# Patient Record
Sex: Female | Born: 1945 | Race: White | Hispanic: No
Health system: Southern US, Community
[De-identification: ages and names within clinical notes are randomized; demographics above are authoritative.]

## PROBLEM LIST (undated history)

## (undated) DIAGNOSIS — I519 Heart disease, unspecified: Secondary | ICD-10-CM

## (undated) DIAGNOSIS — Z978 Presence of other specified devices: Secondary | ICD-10-CM

## (undated) DIAGNOSIS — I4891 Unspecified atrial fibrillation: Secondary | ICD-10-CM

## (undated) DIAGNOSIS — Z933 Colostomy status: Secondary | ICD-10-CM

## (undated) DIAGNOSIS — D649 Anemia, unspecified: Secondary | ICD-10-CM

## (undated) DIAGNOSIS — I509 Heart failure, unspecified: Secondary | ICD-10-CM

## (undated) DIAGNOSIS — E785 Hyperlipidemia, unspecified: Secondary | ICD-10-CM

## (undated) DIAGNOSIS — I219 Acute myocardial infarction, unspecified: Secondary | ICD-10-CM

## (undated) DIAGNOSIS — F0390 Unspecified dementia without behavioral disturbance: Secondary | ICD-10-CM

## (undated) DIAGNOSIS — L8990 Pressure ulcer of unspecified site, unspecified stage: Secondary | ICD-10-CM

## (undated) DIAGNOSIS — E119 Type 2 diabetes mellitus without complications: Secondary | ICD-10-CM

## (undated) DIAGNOSIS — N189 Chronic kidney disease, unspecified: Secondary | ICD-10-CM

## (undated) HISTORY — DX: Hyperlipidemia, unspecified: E78.5

## (undated) HISTORY — DX: Colostomy status: Z93.3

## (undated) HISTORY — DX: Heart disease, unspecified: I51.9

## (undated) HISTORY — DX: Anemia, unspecified: D64.9

## (undated) HISTORY — PX: COLOSTOMY: SHX63

## (undated) HISTORY — DX: Chronic kidney disease, unspecified: N18.9

## (undated) HISTORY — DX: Type 2 diabetes mellitus without complications: E11.9

## (undated) HISTORY — DX: Unspecified dementia, unspecified severity, without behavioral disturbance, psychotic disturbance, mood disturbance, and anxiety: F03.90

## (undated) HISTORY — DX: Acute myocardial infarction, unspecified: I21.9

## (undated) HISTORY — DX: Presence of other specified devices: Z97.8

## (undated) HISTORY — DX: Heart failure, unspecified: I50.9

## (undated) HISTORY — DX: Pressure ulcer of unspecified site, unspecified stage: L89.90

## (undated) HISTORY — DX: Unspecified atrial fibrillation: I48.91

---

## 2020-02-24 ENCOUNTER — Encounter: Payer: Self-pay | Admitting: Vascular Surgery

## 2020-02-24 ENCOUNTER — Other Ambulatory Visit: Payer: Self-pay

## 2020-02-24 ENCOUNTER — Ambulatory Visit (INDEPENDENT_AMBULATORY_CARE_PROVIDER_SITE_OTHER): Payer: Medicare Other | Admitting: Vascular Surgery

## 2020-02-24 VITALS — BP 116/68 | HR 74 | Resp 20 | Wt 143.0 lb

## 2020-02-24 DIAGNOSIS — I70299 Other atherosclerosis of native arteries of extremities, unspecified extremity: Secondary | ICD-10-CM | POA: Diagnosis not present

## 2020-02-24 DIAGNOSIS — L97909 Non-pressure chronic ulcer of unspecified part of unspecified lower leg with unspecified severity: Secondary | ICD-10-CM

## 2020-02-24 NOTE — H&P (View-Only) (Signed)
 REASON FOR CONSULT:    Nonhealing wounds both feet.  The consult is requested by [facility].  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  Without revascularization I think the wounds are unlikely to heal and she would ultimately require bilateral amputations.  This is a complicated situation and that the patient does have some underlying dementia and there is no family available.  I have done my best to explain the situation to the patient and I feel that it would be reasonable to proceed with arteriography to see if there are any options from an endovascular standpoint to improve the circulation and improve the chances of healing these wounds.  I do not think she would be a good surgical candidate.  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  Although her duplex scan suggested a common femoral artery occlusion on the right I believe that I can feel a pulse on the right perhaps above the occlusion.  On the left side I was unable to feel a femoral pulse although I had to examine her in the wheelchair which certainly limits my exam.  I have reviewed with the patient the indications for arteriography. In addition, I have reviewed the potential complications of arteriography including but not limited to: Bleeding, arterial injury, arterial thrombosis, dye action, renal insufficiency, or other unpredictable medical problems. I have explained to the patient that if we find disease amenable to angioplasty we could potentially address this at the same time. I have discussed the potential complications of angioplasty and stenting, including but not limited to: Bleeding, arterial thrombosis, arterial injury, dissection, or the need for surgical intervention.  Her arteriogram is scheduled for 03/04/2020.  We will hold her Eliquis for 48  hours prior to the procedure.  I do not have any preoperative labs obviously we will have to check her renal function to be sure that she can receive contrast.  Otherwise she would require CO2.   Sabrina Maloney, MD Office: [phone]   HPI:   [name] is a pleasant 74 y.o. female, who is referred with wounds on both feet.  I have reviewed the records from [facility].  Patient was seen on 02/08/2020 with significant ulcerations of both feet and surrounding erythema.  She also had erythema of both heels.  She is sent for vascular consultation.  The patient has a history of underlying dementia so the history I think is not completely reliable.  She is not sure when she developed the wounds on her feet.  She has wounds on both heels and also wounds on the toes of both feet.  I do not think that she is ambulatory.  She denies any history of rest pain.  Her risk factors for peripheral vascular disease include diabetes, hypercholesterolemia, and hypertension.  She denies tobacco use.  There is no history of premature cardiovascular disease that I can determine.  I reviewed the records that were sent with the patient and the patient does have a history of type 2 diabetes, history of a CVA with right-sided weakness and dysphagia, hyperlipidemia, vascular dementia, coronary artery disease, previous myocardial infarction, paroxysmal atrial fibrillation, chronic systolic congestive heart failure, and stage III chronic kidney disease.  She is also had a previous colostomy.   Past Medical History:  Diagnosis Date  . A-fib (  HCC)   . Anemia   . CHF (congestive heart failure) (HCC)   . Chronic kidney disease   . Colostomy present (HCC)   . Dementia (HCC)   . Diabetes mellitus without complication (HCC)   . Foley catheter in place   . Heart disease   . Hyperlipidemia   . Myocardial infarction (HCC)   . Pressure ulcer     No family history on  file.  SOCIAL HISTORY: Social History   Socioeconomic History  . Marital status: Unknown    Spouse name: Not on file  . Number of children: Not on file  . Years of education: Not on file  . Highest education level: Not on file  Occupational History  . Not on file  Tobacco Use  . Smoking status: Unknown If Ever Smoked  . Smokeless tobacco: Never Used  Substance and Sexual Activity  . Alcohol use: Not on file  . Drug use: Not on file  . Sexual activity: Not on file  Other Topics Concern  . Not on file  Social History Narrative  . Not on file   Social Determinants of Health   Financial Resource Strain:   . Difficulty of Paying Living Expenses:   Food Insecurity:   . Worried About Programme researcher, broadcasting/film/video in the Last Year:   . Barista in the Last Year:   Transportation Needs:   . Freight forwarder (Medical):   Marland Kitchen Lack of Transportation (Non-Medical):   Physical Activity:   . Days of Exercise per Week:   . Minutes of Exercise per Session:   Stress:   . Feeling of Stress :   Social Connections:   . Frequency of Communication with Friends and Family:   . Frequency of Social Gatherings with Friends and Family:   . Attends Religious Services:   . Active Member of Clubs or Organizations:   . Attends Banker Meetings:   Marland Kitchen Marital Status:   Intimate Partner Violence:   . Fear of Current or Ex-Partner:   . Emotionally Abused:   Marland Kitchen Physically Abused:   . Sexually Abused:     Allergies  Allergen Reactions  . Codeine   . Erythromycin   . Sulfa Antibiotics     Current Outpatient Medications  Medication Sig Dispense Refill  . Amino Acids (AMINO ACID PO) Take by mouth.    Marland Kitchen apixaban (ELIQUIS) 5 MG TABS tablet Take 5 mg by mouth 2 (two) times daily.    . Ascorbic Acid (VITAMIN C) 100 MG tablet Take 100 mg by mouth daily.    Marland Kitchen aspirin EC 81 MG tablet Take 81 mg by mouth daily.    . betamethasone dipropionate 0.05 % cream Apply topically 2 (two) times  daily.    . cholecalciferol (VITAMIN D3) 25 MCG (1000 UNIT) tablet Take 1,000 Units by mouth daily.    . famotidine (PEPCID) 20 MG tablet Take 20 mg by mouth 2 (two) times daily.    Marland Kitchen gabapentin (NEURONTIN) 100 MG capsule Take 200 mg by mouth 3 (three) times daily.    . insulin detemir (LEVEMIR) 100 UNIT/ML injection Inject into the skin daily.    . insulin lispro (HUMALOG KWIKPEN) 100 UNIT/ML KwikPen Inject into the skin.    . metoprolol succinate (TOPROL-XL) 12.5 mg TB24 24 hr tablet Take 12.5 mg by mouth daily.    . Multiple Vitamin (MULTIVITAMIN) capsule Take 1 capsule by mouth daily.    . polyethylene glycol (MIRALAX /  GLYCOLAX) 17 g packet Take 17 g by mouth daily.    . QUEtiapine (SEROQUEL) 50 MG tablet Take 50 mg by mouth at bedtime.    . tamsulosin (FLOMAX) 0.4 MG CAPS capsule Take 0.4 mg by mouth.    . zinc gluconate 50 MG tablet Take 50 mg by mouth daily.     No current facility-administered medications for this visit.    REVIEW OF SYSTEMS:  [X]  denotes positive finding, [ ]  denotes negative finding Cardiac  Comments:  Chest pain or chest pressure:    Shortness of breath upon exertion:    Short of breath when lying flat:    Irregular heart rhythm:        Vascular    Pain in calf, thigh, or hip brought on by ambulation:    Pain in feet at night that wakes you up from your sleep:     Blood clot in your veins:    Leg swelling:         Pulmonary    Oxygen at home:    Productive cough:     Wheezing:         Neurologic    Sudden weakness in arms or legs:     Sudden numbness in arms or legs:     Sudden onset of difficulty speaking or slurred speech:    Temporary loss of vision in one eye:     Problems with dizziness:         Gastrointestinal    Blood in stool:     Vomited blood:         Genitourinary    Burning when urinating:     Blood in urine:        Psychiatric    Major depression:         Hematologic    Bleeding problems:    Problems with blood clotting  too easily:        Skin    Rashes or ulcers:        Constitutional    Fever or chills:     PHYSICAL EXAM:   Vitals:   02/24/20 1240  BP: 116/68  Pulse: 74  Resp: 20  SpO2: 95%  Weight: 143 lb (64.9 kg)    GENERAL: The patient is a well-nourished female, in no acute distress. The vital signs are documented above. CARDIAC: There is a regular rate and rhythm.  VASCULAR: I do not detect carotid bruits. I had to examine her in the wheelchair which limits my exam.  I believe that I could feel a femoral pulse on the right but could not feel one on the left.  Based on her duplex findings perhaps some feeling the pulse on the right above the occlusion. I cannot palpate pedal pulses. She has mild bilateral lower extremity swelling. PULMONARY: There is good air exchange bilaterally without wheezing or rales. ABDOMEN: Soft and non-tender with normal pitched bowel sounds.  MUSCULOSKELETAL: There are no major deformities or cyanosis. NEUROLOGIC: No focal weakness or paresthesias are detected. SKIN: RIGHT FOOT:    LEFT FOOT:     PSYCHIATRIC: The patient has a normal affect.  DATA:    ARTERIAL DUPLEX: I did review an arterial duplex scan that was done on 02/04/2020.  This showed that on the right side the common femoral and superficial femoral arteries were occluded.  There was also evidence of significant disease on the left side without occlusions noted.  ABIs could not be performed because the patient  has wounds on both feet.

## 2020-02-24 NOTE — Progress Notes (Signed)
REASON FOR CONSULT:    Nonhealing wounds both feet.  The consult is requested by [facility].  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  Without revascularization I think the wounds are unlikely to heal and she would ultimately require bilateral amputations.  This is a complicated situation and that the patient does have some underlying dementia and there is no family available.  I have done my best to explain the situation to the patient and I feel that it would be reasonable to proceed with arteriography to see if there are any options from an endovascular standpoint to improve the circulation and improve the chances of healing these wounds.  I do not think she would be a good surgical candidate.  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  Although her duplex scan suggested a common femoral artery occlusion on the right I believe that I can feel a pulse on the right perhaps above the occlusion.  On the left side I was unable to feel a femoral pulse although I had to examine her in the wheelchair which certainly limits my exam.  I have reviewed with the patient the indications for arteriography. In addition, I have reviewed the potential complications of arteriography including but not limited to: Bleeding, arterial injury, arterial thrombosis, dye action, renal insufficiency, or other unpredictable medical problems. I have explained to the patient that if we find disease amenable to angioplasty we could potentially address this at the same time. I have discussed the potential complications of angioplasty and stenting, including but not limited to: Bleeding, arterial thrombosis, arterial injury, dissection, or the need for surgical intervention.  Her arteriogram is scheduled for 03/04/2020.  We will hold her Eliquis for 48  hours prior to the procedure.  I do not have any preoperative labs obviously we will have to check her renal function to be sure that she can receive contrast.  Otherwise she would require CO2.   Waverly Ferrari, MD Office: [phone]   HPI:   [name] is a pleasant 74 y.o. female, who is referred with wounds on both feet.  I have reviewed the records from [facility] and rehabilitation center.  Patient was seen on 02/08/2020 with significant ulcerations of both feet and surrounding erythema.  She also had erythema of both heels.  She is sent for vascular consultation.  The patient has a history of underlying dementia so the history I think is not completely reliable.  She is not sure when she developed the wounds on her feet.  She has wounds on both heels and also wounds on the toes of both feet.  I do not think that she is ambulatory.  She denies any history of rest pain.  Her risk factors for peripheral vascular disease include diabetes, hypercholesterolemia, and hypertension.  She denies tobacco use.  There is no history of premature cardiovascular disease that I can determine.  I reviewed the records that were sent with the patient and the patient does have a history of type 2 diabetes, history of a CVA with right-sided weakness and dysphagia, hyperlipidemia, vascular dementia, coronary artery disease, previous myocardial infarction, paroxysmal atrial fibrillation, chronic systolic congestive heart failure, and stage III chronic kidney disease.  She is also had a previous colostomy.   Past Medical History:  Diagnosis Date  . A-fib (  HCC)   . Anemia   . CHF (congestive heart failure) (HCC)   . Chronic kidney disease   . Colostomy present (HCC)   . Dementia (HCC)   . Diabetes mellitus without complication (HCC)   . Foley catheter in place   . Heart disease   . Hyperlipidemia   . Myocardial infarction (HCC)   . Pressure ulcer     No family history on  file.  SOCIAL HISTORY: Social History   Socioeconomic History  . Marital status: Unknown    Spouse name: Not on file  . Number of children: Not on file  . Years of education: Not on file  . Highest education level: Not on file  Occupational History  . Not on file  Tobacco Use  . Smoking status: Unknown If Ever Smoked  . Smokeless tobacco: Never Used  Substance and Sexual Activity  . Alcohol use: Not on file  . Drug use: Not on file  . Sexual activity: Not on file  Other Topics Concern  . Not on file  Social History Narrative  . Not on file   Social Determinants of Health   Financial Resource Strain:   . Difficulty of Paying Living Expenses:   Food Insecurity:   . Worried About Programme researcher, broadcasting/film/video in the Last Year:   . Barista in the Last Year:   Transportation Needs:   . Freight forwarder (Medical):   Marland Kitchen Lack of Transportation (Non-Medical):   Physical Activity:   . Days of Exercise per Week:   . Minutes of Exercise per Session:   Stress:   . Feeling of Stress :   Social Connections:   . Frequency of Communication with Friends and Family:   . Frequency of Social Gatherings with Friends and Family:   . Attends Religious Services:   . Active Member of Clubs or Organizations:   . Attends Banker Meetings:   Marland Kitchen Marital Status:   Intimate Partner Violence:   . Fear of Current or Ex-Partner:   . Emotionally Abused:   Marland Kitchen Physically Abused:   . Sexually Abused:     Allergies  Allergen Reactions  . Codeine   . Erythromycin   . Sulfa Antibiotics     Current Outpatient Medications  Medication Sig Dispense Refill  . Amino Acids (AMINO ACID PO) Take by mouth.    Marland Kitchen apixaban (ELIQUIS) 5 MG TABS tablet Take 5 mg by mouth 2 (two) times daily.    . Ascorbic Acid (VITAMIN C) 100 MG tablet Take 100 mg by mouth daily.    Marland Kitchen aspirin EC 81 MG tablet Take 81 mg by mouth daily.    . betamethasone dipropionate 0.05 % cream Apply topically 2 (two) times  daily.    . cholecalciferol (VITAMIN D3) 25 MCG (1000 UNIT) tablet Take 1,000 Units by mouth daily.    . famotidine (PEPCID) 20 MG tablet Take 20 mg by mouth 2 (two) times daily.    Marland Kitchen gabapentin (NEURONTIN) 100 MG capsule Take 200 mg by mouth 3 (three) times daily.    . insulin detemir (LEVEMIR) 100 UNIT/ML injection Inject into the skin daily.    . insulin lispro (HUMALOG KWIKPEN) 100 UNIT/ML KwikPen Inject into the skin.    . metoprolol succinate (TOPROL-XL) 12.5 mg TB24 24 hr tablet Take 12.5 mg by mouth daily.    . Multiple Vitamin (MULTIVITAMIN) capsule Take 1 capsule by mouth daily.    . polyethylene glycol (MIRALAX /  GLYCOLAX) 17 g packet Take 17 g by mouth daily.    . QUEtiapine (SEROQUEL) 50 MG tablet Take 50 mg by mouth at bedtime.    . tamsulosin (FLOMAX) 0.4 MG CAPS capsule Take 0.4 mg by mouth.    . zinc gluconate 50 MG tablet Take 50 mg by mouth daily.     No current facility-administered medications for this visit.    REVIEW OF SYSTEMS:  [X]  denotes positive finding, [ ]  denotes negative finding Cardiac  Comments:  Chest pain or chest pressure:    Shortness of breath upon exertion:    Short of breath when lying flat:    Irregular heart rhythm:        Vascular    Pain in calf, thigh, or hip brought on by ambulation:    Pain in feet at night that wakes you up from your sleep:     Blood clot in your veins:    Leg swelling:         Pulmonary    Oxygen at home:    Productive cough:     Wheezing:         Neurologic    Sudden weakness in arms or legs:     Sudden numbness in arms or legs:     Sudden onset of difficulty speaking or slurred speech:    Temporary loss of vision in one eye:     Problems with dizziness:         Gastrointestinal    Blood in stool:     Vomited blood:         Genitourinary    Burning when urinating:     Blood in urine:        Psychiatric    Major depression:         Hematologic    Bleeding problems:    Problems with blood clotting  too easily:        Skin    Rashes or ulcers:        Constitutional    Fever or chills:     PHYSICAL EXAM:   Vitals:   02/24/20 1240  BP: 116/68  Pulse: 74  Resp: 20  SpO2: 95%  Weight: 143 lb (64.9 kg)    GENERAL: The patient is a well-nourished female, in no acute distress. The vital signs are documented above. CARDIAC: There is a regular rate and rhythm.  VASCULAR: I do not detect carotid bruits. I had to examine her in the wheelchair which limits my exam.  I believe that I could feel a femoral pulse on the right but could not feel one on the left.  Based on her duplex findings perhaps some feeling the pulse on the right above the occlusion. I cannot palpate pedal pulses. She has mild bilateral lower extremity swelling. PULMONARY: There is good air exchange bilaterally without wheezing or rales. ABDOMEN: Soft and non-tender with normal pitched bowel sounds.  MUSCULOSKELETAL: There are no major deformities or cyanosis. NEUROLOGIC: No focal weakness or paresthesias are detected. SKIN: RIGHT FOOT:    LEFT FOOT:     PSYCHIATRIC: The patient has a normal affect.  DATA:    ARTERIAL DUPLEX: I did review an arterial duplex scan that was done on 02/04/2020.  This showed that on the right side the common femoral and superficial femoral arteries were occluded.  There was also evidence of significant disease on the left side without occlusions noted.  ABIs could not be performed because the patient  has wounds on both feet.

## 2020-02-26 ENCOUNTER — Other Ambulatory Visit: Payer: Self-pay

## 2020-03-03 ENCOUNTER — Other Ambulatory Visit (HOSPITAL_COMMUNITY): Payer: Self-pay | Admitting: *Deleted

## 2020-03-04 ENCOUNTER — Encounter (HOSPITAL_COMMUNITY): Admission: RE | Payer: Self-pay | Source: Home / Self Care

## 2020-03-04 ENCOUNTER — Ambulatory Visit (HOSPITAL_COMMUNITY): Admission: RE | Admit: 2020-03-04 | Payer: Medicare Other | Source: Home / Self Care | Admitting: Vascular Surgery

## 2020-03-04 SURGERY — ABDOMINAL AORTOGRAM W/LOWER EXTREMITY
Anesthesia: LOCAL

## 2020-03-16 ENCOUNTER — Telehealth: Payer: Self-pay

## 2020-03-16 ENCOUNTER — Other Ambulatory Visit: Payer: Self-pay

## 2020-03-16 ENCOUNTER — Emergency Department (HOSPITAL_COMMUNITY): Payer: Medicare Other

## 2020-03-16 ENCOUNTER — Emergency Department (HOSPITAL_COMMUNITY)
Admission: EM | Admit: 2020-03-16 | Discharge: 2020-03-16 | Disposition: A | Discharge status: Home / Self Care | Payer: Medicare Other | Attending: Emergency Medicine | Admitting: Emergency Medicine

## 2020-03-16 ENCOUNTER — Encounter (HOSPITAL_COMMUNITY): Payer: Self-pay | Admitting: Emergency Medicine

## 2020-03-16 DIAGNOSIS — S0083XA Contusion of other part of head, initial encounter: Secondary | ICD-10-CM | POA: Insufficient documentation

## 2020-03-16 DIAGNOSIS — Y929 Unspecified place or not applicable: Secondary | ICD-10-CM | POA: Insufficient documentation

## 2020-03-16 DIAGNOSIS — W19XXXA Unspecified fall, initial encounter: Secondary | ICD-10-CM | POA: Diagnosis not present

## 2020-03-16 DIAGNOSIS — N189 Chronic kidney disease, unspecified: Secondary | ICD-10-CM | POA: Insufficient documentation

## 2020-03-16 DIAGNOSIS — S0011XA Contusion of right eyelid and periocular area, initial encounter: Secondary | ICD-10-CM | POA: Insufficient documentation

## 2020-03-16 DIAGNOSIS — Z7982 Long term (current) use of aspirin: Secondary | ICD-10-CM | POA: Diagnosis not present

## 2020-03-16 DIAGNOSIS — F0390 Unspecified dementia without behavioral disturbance: Secondary | ICD-10-CM | POA: Diagnosis not present

## 2020-03-16 DIAGNOSIS — S0990XA Unspecified injury of head, initial encounter: Secondary | ICD-10-CM

## 2020-03-16 DIAGNOSIS — Z794 Long term (current) use of insulin: Secondary | ICD-10-CM | POA: Diagnosis not present

## 2020-03-16 DIAGNOSIS — Y999 Unspecified external cause status: Secondary | ICD-10-CM | POA: Insufficient documentation

## 2020-03-16 DIAGNOSIS — R8290 Unspecified abnormal findings in urine: Secondary | ICD-10-CM | POA: Diagnosis not present

## 2020-03-16 DIAGNOSIS — E1122 Type 2 diabetes mellitus with diabetic chronic kidney disease: Secondary | ICD-10-CM | POA: Diagnosis not present

## 2020-03-16 DIAGNOSIS — I129 Hypertensive chronic kidney disease with stage 1 through stage 4 chronic kidney disease, or unspecified chronic kidney disease: Secondary | ICD-10-CM | POA: Insufficient documentation

## 2020-03-16 DIAGNOSIS — Y939 Activity, unspecified: Secondary | ICD-10-CM | POA: Diagnosis not present

## 2020-03-16 LAB — URINALYSIS, ROUTINE W REFLEX MICROSCOPIC
Bilirubin Urine: NEGATIVE
Glucose, UA: NEGATIVE mg/dL
Hgb urine dipstick: NEGATIVE
Ketones, ur: NEGATIVE mg/dL
Nitrite: NEGATIVE
Protein, ur: 30 mg/dL — AB
Specific Gravity, Urine: 1.017 (ref 1.005–1.030)
WBC, UA: >50 WBC/hpf — ABNORMAL HIGH (ref 0–5)
pH: 5 (ref 5.0–8.0)

## 2020-03-16 LAB — CBC WITH DIFFERENTIAL/PLATELET
Abs Immature Granulocytes: 0.03 10*3/uL (ref 0.00–0.07)
Basophils Absolute: 0 10*3/uL (ref 0.0–0.1)
Basophils Relative: 1 %
Eosinophils Absolute: 0.3 10*3/uL (ref 0.0–0.5)
Eosinophils Relative: 4 %
HCT: 27.4 % — ABNORMAL LOW (ref 36.0–46.0)
Hemoglobin: 8.1 g/dL — ABNORMAL LOW (ref 12.0–15.0)
Immature Granulocytes: 0 %
Lymphocytes Relative: 27 %
Lymphs Abs: 2.2 10*3/uL (ref 0.7–4.0)
MCH: 25.9 pg — ABNORMAL LOW (ref 26.0–34.0)
MCHC: 29.6 g/dL — ABNORMAL LOW (ref 30.0–36.0)
MCV: 87.5 fL (ref 80.0–100.0)
Monocytes Absolute: 0.7 10*3/uL (ref 0.1–1.0)
Monocytes Relative: 9 %
Neutro Abs: 4.7 10*3/uL (ref 1.7–7.7)
Neutrophils Relative %: 59 %
Platelets: 343 10*3/uL (ref 150–400)
RBC: 3.13 MIL/uL — ABNORMAL LOW (ref 3.87–5.11)
RDW: 17.7 % — ABNORMAL HIGH (ref 11.5–15.5)
WBC: 7.8 10*3/uL (ref 4.0–10.5)
nRBC: 0 % (ref 0.0–0.2)

## 2020-03-16 LAB — BASIC METABOLIC PANEL
Anion gap: 8 (ref 5–15)
BUN: 34 mg/dL — ABNORMAL HIGH (ref 8–23)
CO2: 23 mmol/L (ref 22–32)
Calcium: 8.2 mg/dL — ABNORMAL LOW (ref 8.9–10.3)
Chloride: 109 mmol/L (ref 98–111)
Creatinine, Ser: 1.21 mg/dL — ABNORMAL HIGH (ref 0.44–1.00)
GFR calc Af Amer: 52 mL/min — ABNORMAL LOW (ref >60)
GFR calc non Af Amer: 45 mL/min — ABNORMAL LOW (ref >60)
Glucose, Bld: 111 mg/dL — ABNORMAL HIGH (ref 70–99)
Potassium: 3.9 mmol/L (ref 3.5–5.1)
Sodium: 140 mmol/L (ref 135–145)

## 2020-03-16 LAB — POC OCCULT BLOOD, ED: Fecal Occult Bld: NEGATIVE

## 2020-03-16 MED ORDER — CEPHALEXIN 500 MG PO CAPS
500.0000 mg | ORAL_CAPSULE | Freq: Three times a day (TID) | ORAL | 0 refills | Status: DC
Start: 2020-03-16 — End: 2020-04-01

## 2020-03-16 MED ORDER — CEPHALEXIN 500 MG PO CAPS
500.0000 mg | ORAL_CAPSULE | Freq: Once | ORAL | Status: DC
  Filled 2020-03-16: qty 1

## 2020-03-16 MED ORDER — FLUCONAZOLE 150 MG PO TABS
150.0000 mg | ORAL_TABLET | Freq: Once | ORAL | Status: DC
  Filled 2020-03-16: qty 1

## 2020-03-16 NOTE — Discharge Instructions (Signed)
Testing is negative for any serious injury from the fall.  Take the antibiotic for a possible urinary tract infection.  You will be called if the urine culture grows a bacteria that needs a different antibiotic.  Follow-up with your doctor.  Return to the ED with new or worsening symptoms.

## 2020-03-16 NOTE — Telephone Encounter (Signed)
Message from 03/04/20:  Good morning! We spoke to Kizzie Ide at [facility] on 6/11 and gave him this information. We'll be happy to reschedule with the patient and facility.   Almendra Loria       Previous Messages   ----- Message -----  From: Jolyn Nap, RN  Sent: 03/04/2020  8:10 AM EDT  To: Yolonda Kida, LPN  Subject: Reschedule                    Ms. Kimbler did not show for her procedure today. I called nursing home around 6am they said they didn't know anything about her going to procedure for today.    Can you reschedule her please   Thanks   Janne Napoleon  Cath lab

## 2020-03-16 NOTE — ED Notes (Signed)
Daughter: [phone] would like to receive updates

## 2020-03-16 NOTE — ED Provider Notes (Signed)
[facility]GENCY DEPARTMENT Provider Note   CSN: [id number] Arrival date & time: 03/16/20  0153     History Chief Complaint  Patient presents with  . Fall    Lorin Picketheresa Juarez is a 74 y.o. female.  Level 5 caveat for dementia.  Patient brought in from facility after being found on the ground after presumed unwitnessed fall.  She has a hematoma in her right forehead.  Patient unable to give any history.  She is arousable to voice and follows commands but cannot give any meaningful information. She apparently received her nighttime meds including seroquel and ativan prior to arrival. States that she is not hurting anywhere. States that she did fall Denies any chest pain or abdominal pain or back pain.  Past medical history includes:history of type 2 diabetes, history of a CVA with right-sided weakness and dysphagia, hyperlipidemia, vascular dementia, coronary artery disease, previous myocardial infarction, paroxysmal atrial fibrillation, chronic systolic congestive heart failure, and stage III chronic kidney disease.  She is also had a previous colostomy. She has an indwelling Foley in place.  She follows with vascular surgery for chronic wounds on her lower extremities. eliquis is on medication list. Unclear if she is taking it.   The history is provided by the patient and the EMS personnel. The history is limited by the condition of the patient.  Fall       Past Medical History:  Diagnosis Date  . A-fib (HCC)   . Anemia   . CHF (congestive heart failure) (HCC)   . Chronic kidney disease   . Colostomy present (HCC)   . Dementia (HCC)   . Diabetes mellitus without complication (HCC)   . Foley catheter in place   . Heart disease   . Hyperlipidemia   . Myocardial infarction (HCC)   . Pressure ulcer     There are no problems to display for this patient.   Past Surgical History:  Procedure Laterality Date  . COLOSTOMY       OB History   No obstetric history on  file.     No family history on file.  Social History   Tobacco Use  . Smoking status: Unknown If Ever Smoked  . Smokeless tobacco: Never Used  Substance Use Topics  . Alcohol use: Not Currently  . Drug use: Not Currently    Home Medications Prior to Admission medications   Medication Sig Start Date End Date Taking? Authorizing Provider  acetaminophen (TYLENOL) 500 MG tablet Take 1,000 mg by mouth in the morning and at bedtime.    [provider]  Amino Acids-Protein Hydrolys (FEEDING SUPPLEMENT, PRO-STAT SUGAR FREE 64,) LIQD Take 30 mLs by mouth daily with breakfast.    [provider]  apixaban (ELIQUIS) 5 MG TABS tablet Take 5 mg by mouth 2 (two) times daily. Patient not taking: Reported on 03/02/2020    [provider]  Ascorbic Acid (VITAMIN C) 500 MG CHEW Chew 500 mg by mouth in the morning and at bedtime.     [provider]  aspirin EC 81 MG tablet Take 81 mg by mouth daily.    [provider]  Cholecalciferol (VITAMIN D) 50 MCG (2000 UT) tablet Take 2,000 Units by mouth daily.     [provider]  famotidine (PEPCID) 20 MG tablet Take 20 mg by mouth daily with breakfast.     [provider]  gabapentin (NEURONTIN) 100 MG capsule Take 200 mg by mouth 3 (three) times daily.  [provider]  insulin detemir (LEVEMIR) 100 UNIT/ML injection Inject 14 Units into the skin daily.     [provider]  insulin lispro (HUMALOG KWIKPEN) 100 UNIT/ML KwikPen Inject 0-10 Units into the skin 3 (three) times daily with meals. Dose per sliding scale. 0-150 = 0 units; 151-200 = 2 units; 201-250 = 4 units; 251-300 = 6 units; 301-350 = 8 units; 351-400 = 10 units; >400, notify MD.    [provider]  LORazepam (ATIVAN) 0.5 MG tablet Take 0.5 mg by mouth every 6 (six) hours as needed for anxiety (agitation).    [provider]  metoprolol tartrate (LOPRESSOR) 25 MG tablet Take 12.5 mg by mouth daily.     [provider]  Multiple Vitamin (MULTIVITAMIN) capsule Take 1 capsule by mouth daily.    [provider]  polyethylene glycol (MIRALAX / GLYCOLAX) 17 g packet Take 17 g by mouth daily as needed (constipation).     [provider]  QUEtiapine (SEROQUEL) 50 MG tablet Take 50 mg by mouth at bedtime.    [provider]  tamsulosin (FLOMAX) 0.4 MG CAPS capsule Take 0.4 mg by mouth at bedtime.     [provider]  zinc sulfate 220 (50 Zn) MG capsule Take 220 mg by mouth daily.    [provider]    Allergies    Codeine, Erythromycin, and Sulfa antibiotics  Review of Systems   Review of Systems  Unable to perform ROS: Dementia    Physical Exam Updated Vital Signs BP (!) 135/59 (BP Location: Left Arm)   Pulse 81   Temp 98.9 F (37.2 C) (Oral)   Resp 18   Wt 64 kg   SpO2 98%   Physical Exam Vitals and nursing note reviewed.  Constitutional:      General: She is not in acute distress.    Appearance: She is well-developed.     Comments: Somnolent, arouses to voice, answers a few questions and goes back to sleep.  HENT:     Head: Normocephalic.     Comments: Hematoma right eyebrow and forehead    Mouth/Throat:     Pharynx: No oropharyngeal exudate.  Eyes:     Conjunctiva/sclera: Conjunctivae normal.     Pupils: Pupils are equal, round, and reactive to light.  Neck:     Comments: No C-spine tenderness Cardiovascular:     Rate and Rhythm: Normal rate and regular rhythm.     Heart sounds: Normal heart sounds. No murmur heard.   Pulmonary:     Effort: Pulmonary effort is normal. No respiratory distress.     Breath sounds: Normal breath sounds.  Abdominal:     Palpations: Abdomen is soft.     Tenderness: There is no abdominal tenderness. There is no guarding or rebound.     Comments: Colostomy in place abdomen soft.  Genitourinary:    Comments: Foley catheter in place Musculoskeletal:        General: No tenderness.  Normal range of motion.     Cervical back: Normal range of motion.     Comments: Feet wrapped from chronic wounds.  She is wearing pressure boots.  She is able to flex and extend her hips and knees without pain.  Skin:    General: Skin is warm.  Neurological:     Mental Status: She is alert.     Motor: No abnormal muscle tone.     Comments: Somnolent, resting with eyes closed, arouses to voice and  follows some commands.  Moves all extremities equally   Psychiatric:        Behavior: Behavior normal.     ED Results / Procedures / Treatments   Labs (all labs ordered are listed, but only abnormal results are displayed) Labs Reviewed  CBC WITH DIFFERENTIAL/PLATELET - Abnormal; Notable for the following components:      Result Value   RBC 3.13 (*)    Hemoglobin 8.1 (*)    HCT 27.4 (*)    MCH 25.9 (*)    MCHC 29.6 (*)    RDW 17.7 (*)    All other components within normal limits  BASIC METABOLIC PANEL - Abnormal; Notable for the following components:   Glucose, Bld 111 (*)    BUN 34 (*)    Creatinine, Ser 1.21 (*)    Calcium 8.2 (*)    GFR calc non Af Amer 45 (*)    GFR calc Af Amer 52 (*)    All other components within normal limits  URINE CULTURE  URINALYSIS, ROUTINE W REFLEX MICROSCOPIC  POC OCCULT BLOOD, ED    EKG EKG Interpretation  Date/Time:  Wednesday March 16 2020 04:13:47 EDT Ventricular Rate:  71 PR Interval:  168 QRS Duration: 128 QT Interval:  422 QTC Calculation: 458 R Axis:   -4 Text Interpretation: Normal sinus rhythm Left bundle branch block Abnormal ECG No previous ECGs available Confirmed by Glynn Octave [phone]) on 03/16/2020 4:20:47 AM   Radiology CT Head Wo Contrast  Result Date: 03/16/2020 CLINICAL DATA:  Larey Seat, dementia, frontal scalp hematoma EXAM: CT HEAD WITHOUT CONTRAST TECHNIQUE: Contiguous axial images were obtained from the base of the skull through the vertex without intravenous contrast. COMPARISON:  None. FINDINGS: Brain:  Hypodensities are seen throughout the bilateral periventricular white matter and bilateral basal ganglia compatible with age-indeterminate small vessel ischemic changes. Focal hypodensity in the left thalamus is consistent with a chronic lacunar infarct. No other signs of acute infarct or hemorrhage. Lateral ventricles and remaining midline structures are unremarkable. No acute extra-axial fluid collections. No mass effect. Vascular: No hyperdense vessel. There is severe atherosclerosis within the vertebral arteries and bilateral internal carotid arteries. Skull: Small right supraorbital scalp hematoma. No underlying fracture. The remainder of the calvarium is unremarkable. Sinuses/Orbits: No acute finding. Other: None. IMPRESSION: 1. Small right supraorbital scalp hematoma. 2. Age-indeterminate small vessel ischemic changes throughout the white matter and bilateral basal ganglia. Chronic left thalamic lacunar infarct. 3. No acute intracranial trauma. Electronically Signed   By: Sharlet Salina M.D.   On: 03/16/2020 03:46   CT Cervical Spine Wo Contrast  Result Date: 03/16/2020 CLINICAL DATA:  Found down, fell, frontal scalp hematoma EXAM: CT CERVICAL SPINE WITHOUT CONTRAST TECHNIQUE: Multidetector CT imaging of the cervical spine was performed without intravenous contrast. Multiplanar CT image reconstructions were also generated. COMPARISON:  None. FINDINGS: Alignment: Alignment is grossly anatomic. Skull base and vertebrae: There is bony fusion across the C3/C4 disc space. No acute displaced fractures. Soft tissues and spinal canal: No prevertebral fluid or swelling. No visible canal hematoma. Severe atherosclerosis of the carotid vessels bilaterally. Disc levels: There is prominent spondylosis at C4-5, C5-6, and C6-7 there is mild diffuse facet hypertrophy is most pronounced from C3 through C5. Upper chest: Airway is patent.  Lung apices are clear. Other: Reconstructed images demonstrate no additional  findings. IMPRESSION: Multilevel cervical spondylosis and facet hypertrophy. No acute fracture. Electronically Signed   By: Sharlet Salina M.D.   On: 03/16/2020 03:43   DG  Pelvis Portable  Result Date: 03/16/2020 CLINICAL DATA:  Un witnessed fall, found down EXAM: PORTABLE PELVIS 1-2 VIEWS COMPARISON:  None. FINDINGS: Supine frontal view of the pelvis demonstrates intramedullary rod and proximal dynamic screw within the proximal right femur. The distal aspect of the intramedullary rod is excluded by collimation. No acute displaced fractures. Bilateral hip osteoarthritis is noted. The bones are diffusely osteopenic. Soft tissues are unremarkable. IMPRESSION: 1. No acute displaced fracture. Electronically Signed   By: Sharlet Salina M.D.   On: 03/16/2020 03:41   DG Chest Portable 1 View  Result Date: 03/16/2020 CLINICAL DATA:  Un witnessed fall, found down EXAM: PORTABLE CHEST 1 VIEW COMPARISON:  None. FINDINGS: Supine frontal view of the chest demonstrates marked enlargement of the cardiac silhouette. There is central vascular congestion with diffuse interstitial prominence. Patchy areas of consolidation at the lung bases, left greater than right, may reflect atelectasis. Small right pleural effusion cannot be excluded. No pneumothorax on this supine evaluation. No displaced fracture. IMPRESSION: 1. Findings consistent with mild congestive heart failure. 2. Likely atelectasis at the lung bases, left greater than right. Electronically Signed   By: Sharlet Salina M.D.   On: 03/16/2020 03:40    Procedures Procedures (including critical care time)  Medications Ordered in ED Medications - No data to display  ED Course  I have reviewed the triage vital signs and the nursing notes.  Pertinent labs & imaging results that were available during my care of the patient were reviewed by me and considered in my medical decision making (see chart for details).    MDM Rules/Calculators/A&P                          Patient from nursing home with presumed fall.  Found on to the ground with hematoma to forehead.  Patient quite somnolent not able to give any history.  Unclear what her baseline is.  Labs show hemoglobin of 8.1.  No comparison. CT head and C-spine are negative for acute traumatic injury. Mild CHF on chest x-ray without increased work of breathing or hypoxia.  Discussed with patient's daughter [name] by phone.  [name] states patient does have vascular dementia and is wheelchair-bound.  She does not walk.  Patient was hospitalized with Covid in December and was intubated for several weeks on the ventilator and spent a total of 4 months in the hospital. She has some dementia but is normally able to answer a few questions but does not know where she is most of the time. Results discussed with [name]. Hemoglobin of 8. Precious Gilding this near her baseline around 37.  Hemoccult is negative.  Urinalysis is a catheter sample but suspicious for infection.  Culture will be sent.  Will treat empirically with Keflex.  Vitals remained stable.  Patient without complaint.  She appears stable to return to her facility. BP (!) 117/56 (BP Location: Left Arm)   Pulse 65   Temp 98.9 F (37.2 C) (Oral)   Resp 15   Wt 64 kg   SpO2 96%    Final Clinical Impression(s) / ED Diagnoses Final diagnoses:  Fall, initial encounter  Injury of head, initial encounter    Rx / DC Orders ED Discharge Orders         Ordered    cephALEXin (KEFLEX) 500 MG capsule  3 times daily     Discontinue  Reprint     03/16/20 0617  Glynn Octave, MD 03/16/20 [phone]

## 2020-03-16 NOTE — ED Triage Notes (Signed)
Pt has dementia and is from [facility]. She was found in floor and assumed unwitnessed fall. Pt has hematoma to forehead.

## 2020-03-18 LAB — URINE CULTURE: Culture: >=100000 — AB

## 2020-03-20 ENCOUNTER — Telehealth: Payer: Self-pay | Admitting: Emergency Medicine

## 2020-03-20 NOTE — Telephone Encounter (Signed)
Post ED Visit - Positive Culture Follow-up: Successful Patient Follow-Up  Culture assessed and recommendations reviewed by:  []  , Pharm.D. []  Enzo Bi, Pharm.D., BCPS AQ-ID []  , Pharm.D., BCPS []  Celedonio Miyamoto, Pharm.D., BCPS []  Cadiz, Garvin Fila.D., BCPS, AAHIVP []  , Pharm.D., BCPS, AAHIVP []  Georgina Pillion, PharmD, BCPS []  , PharmD, BCPS []  [place], PharmD, BCPS [x]  [address], PharmD  Positive urine culture  []  Patient discharged without antimicrobial prescription and treatment is now indicated [x]  Organism is resistant to prescribed ED discharge antimicrobial []  Patient with positive blood cultures  Changes discussed with ED provider: , [place] New antibiotic prescription Fluconazole 200 mg PO Daily Called/faxed to [facility] p [phone] Fax: [phone]  Contacted [facility] spoke with RN, date 03/20/2020, time 1400  [name] 03/20/2020, 6:22 PM

## 2020-03-25 ENCOUNTER — Encounter: Payer: Self-pay | Admitting: Vascular Surgery

## 2020-03-25 ENCOUNTER — Inpatient Hospital Stay (HOSPITAL_COMMUNITY)
Admission: RE | Admit: 2020-03-25 | Discharge: 2020-04-01 | DRG: 300 | Disposition: A | Discharge status: Skilled Nursing Facility | Payer: Medicare Other | Source: Skilled Nursing Facility | Attending: Vascular Surgery | Admitting: Vascular Surgery

## 2020-03-25 ENCOUNTER — Other Ambulatory Visit: Payer: Self-pay

## 2020-03-25 ENCOUNTER — Encounter (HOSPITAL_COMMUNITY)
Admission: RE | Disposition: A | Discharge status: Skilled Nursing Facility | Payer: Self-pay | Source: Skilled Nursing Facility | Attending: Vascular Surgery

## 2020-03-25 DIAGNOSIS — I252 Old myocardial infarction: Secondary | ICD-10-CM

## 2020-03-25 DIAGNOSIS — I5022 Chronic systolic (congestive) heart failure: Secondary | ICD-10-CM | POA: Diagnosis present

## 2020-03-25 DIAGNOSIS — S91302A Unspecified open wound, left foot, initial encounter: Secondary | ICD-10-CM | POA: Diagnosis present

## 2020-03-25 DIAGNOSIS — R079 Chest pain, unspecified: Secondary | ICD-10-CM | POA: Diagnosis not present

## 2020-03-25 DIAGNOSIS — E1151 Type 2 diabetes mellitus with diabetic peripheral angiopathy without gangrene: Secondary | ICD-10-CM | POA: Diagnosis present

## 2020-03-25 DIAGNOSIS — R29810 Facial weakness: Secondary | ICD-10-CM | POA: Diagnosis present

## 2020-03-25 DIAGNOSIS — Z885 Allergy status to narcotic agent status: Secondary | ICD-10-CM

## 2020-03-25 DIAGNOSIS — I739 Peripheral vascular disease, unspecified: Secondary | ICD-10-CM | POA: Diagnosis present

## 2020-03-25 DIAGNOSIS — Z881 Allergy status to other antibiotic agents status: Secondary | ICD-10-CM

## 2020-03-25 DIAGNOSIS — E785 Hyperlipidemia, unspecified: Secondary | ICD-10-CM | POA: Diagnosis present

## 2020-03-25 DIAGNOSIS — G459 Transient cerebral ischemic attack, unspecified: Secondary | ICD-10-CM | POA: Diagnosis present

## 2020-03-25 DIAGNOSIS — I2510 Atherosclerotic heart disease of native coronary artery without angina pectoris: Secondary | ICD-10-CM | POA: Diagnosis present

## 2020-03-25 DIAGNOSIS — I70203 Unspecified atherosclerosis of native arteries of extremities, bilateral legs: Secondary | ICD-10-CM | POA: Diagnosis not present

## 2020-03-25 DIAGNOSIS — R29709 NIHSS score 9: Secondary | ICD-10-CM | POA: Diagnosis present

## 2020-03-25 DIAGNOSIS — Z79899 Other long term (current) drug therapy: Secondary | ICD-10-CM

## 2020-03-25 DIAGNOSIS — Z933 Colostomy status: Secondary | ICD-10-CM

## 2020-03-25 DIAGNOSIS — F0150 Vascular dementia without behavioral disturbance: Secondary | ICD-10-CM | POA: Diagnosis present

## 2020-03-25 DIAGNOSIS — Z20822 Contact with and (suspected) exposure to covid-19: Secondary | ICD-10-CM | POA: Diagnosis present

## 2020-03-25 DIAGNOSIS — N390 Urinary tract infection, site not specified: Secondary | ICD-10-CM | POA: Diagnosis present

## 2020-03-25 DIAGNOSIS — S91301A Unspecified open wound, right foot, initial encounter: Secondary | ICD-10-CM | POA: Diagnosis present

## 2020-03-25 DIAGNOSIS — L8990 Pressure ulcer of unspecified site, unspecified stage: Secondary | ICD-10-CM | POA: Insufficient documentation

## 2020-03-25 DIAGNOSIS — I959 Hypotension, unspecified: Secondary | ICD-10-CM | POA: Diagnosis not present

## 2020-03-25 DIAGNOSIS — Z882 Allergy status to sulfonamides status: Secondary | ICD-10-CM

## 2020-03-25 DIAGNOSIS — B964 Proteus (mirabilis) (morganii) as the cause of diseases classified elsewhere: Secondary | ICD-10-CM | POA: Diagnosis present

## 2020-03-25 DIAGNOSIS — I70229 Atherosclerosis of native arteries of extremities with rest pain, unspecified extremity: Principal | ICD-10-CM | POA: Diagnosis present

## 2020-03-25 DIAGNOSIS — Z794 Long term (current) use of insulin: Secondary | ICD-10-CM

## 2020-03-25 DIAGNOSIS — Z7982 Long term (current) use of aspirin: Secondary | ICD-10-CM

## 2020-03-25 DIAGNOSIS — I69351 Hemiplegia and hemiparesis following cerebral infarction affecting right dominant side: Secondary | ICD-10-CM

## 2020-03-25 DIAGNOSIS — Z7901 Long term (current) use of anticoagulants: Secondary | ICD-10-CM

## 2020-03-25 DIAGNOSIS — I480 Paroxysmal atrial fibrillation: Secondary | ICD-10-CM | POA: Diagnosis present

## 2020-03-25 DIAGNOSIS — N183 Chronic kidney disease, stage 3 unspecified: Secondary | ICD-10-CM | POA: Diagnosis present

## 2020-03-25 DIAGNOSIS — E1122 Type 2 diabetes mellitus with diabetic chronic kidney disease: Secondary | ICD-10-CM | POA: Diagnosis present

## 2020-03-25 DIAGNOSIS — I6330 Cerebral infarction due to thrombosis of unspecified cerebral artery: Secondary | ICD-10-CM | POA: Insufficient documentation

## 2020-03-25 DIAGNOSIS — I70269 Atherosclerosis of native arteries of extremities with gangrene, unspecified extremity: Secondary | ICD-10-CM

## 2020-03-25 HISTORY — PX: ABDOMINAL AORTOGRAM W/LOWER EXTREMITY: CATH118223

## 2020-03-25 LAB — GLUCOSE, CAPILLARY
Glucose-Capillary: 110 mg/dL — ABNORMAL HIGH (ref 70–99)
Glucose-Capillary: 113 mg/dL — ABNORMAL HIGH (ref 70–99)
Glucose-Capillary: 276 mg/dL — ABNORMAL HIGH (ref 70–99)

## 2020-03-25 LAB — HEMOGLOBIN A1C
Hgb A1c MFr Bld: 8.8 % — ABNORMAL HIGH (ref 4.8–5.6)
Mean Plasma Glucose: 205.86 mg/dL

## 2020-03-25 LAB — URINALYSIS, COMPLETE (UACMP) WITH MICROSCOPIC
Bilirubin Urine: NEGATIVE
Glucose, UA: NEGATIVE mg/dL
Ketones, ur: 5 mg/dL — AB
Nitrite: NEGATIVE
Protein, ur: 100 mg/dL — AB
RBC / HPF: >50 RBC/hpf — ABNORMAL HIGH (ref 0–5)
Specific Gravity, Urine: >1.046 — ABNORMAL HIGH (ref 1.005–1.030)
Squamous Epithelial / HPF: >50 — ABNORMAL HIGH (ref 0–5)
pH: 5 (ref 5.0–8.0)

## 2020-03-25 LAB — POCT I-STAT, CHEM 8
BUN: 28 mg/dL — ABNORMAL HIGH (ref 8–23)
Calcium, Ion: 1.26 mmol/L (ref 1.15–1.40)
Chloride: 106 mmol/L (ref 98–111)
Creatinine, Ser: 1.4 mg/dL — ABNORMAL HIGH (ref 0.44–1.00)
Glucose, Bld: 102 mg/dL — ABNORMAL HIGH (ref 70–99)
HCT: 25 % — ABNORMAL LOW (ref 36.0–46.0)
Hemoglobin: 8.5 g/dL — ABNORMAL LOW (ref 12.0–15.0)
Potassium: 4.5 mmol/L (ref 3.5–5.1)
Sodium: 142 mmol/L (ref 135–145)
TCO2: 28 mmol/L (ref 22–32)

## 2020-03-25 LAB — CBC
HCT: 29.1 % — ABNORMAL LOW (ref 36.0–46.0)
Hemoglobin: 8.4 g/dL — ABNORMAL LOW (ref 12.0–15.0)
MCH: 24.6 pg — ABNORMAL LOW (ref 26.0–34.0)
MCHC: 28.9 g/dL — ABNORMAL LOW (ref 30.0–36.0)
MCV: 85.1 fL (ref 80.0–100.0)
Platelets: 232 10*3/uL (ref 150–400)
RBC: 3.42 MIL/uL — ABNORMAL LOW (ref 3.87–5.11)
RDW: 17.9 % — ABNORMAL HIGH (ref 11.5–15.5)
WBC: 6 10*3/uL (ref 4.0–10.5)
nRBC: 0 % (ref 0.0–0.2)

## 2020-03-25 LAB — CREATININE, SERUM
Creatinine, Ser: 1.31 mg/dL — ABNORMAL HIGH (ref 0.44–1.00)
GFR calc Af Amer: 47 mL/min — ABNORMAL LOW (ref >60)
GFR calc non Af Amer: 41 mL/min — ABNORMAL LOW (ref >60)

## 2020-03-25 SURGERY — ABDOMINAL AORTOGRAM W/LOWER EXTREMITY
Anesthesia: LOCAL | Laterality: Bilateral

## 2020-03-25 MED ORDER — FENTANYL CITRATE (PF) 100 MCG/2ML IJ SOLN
INTRAMUSCULAR | Status: AC
  Filled 2020-03-25: qty 2

## 2020-03-25 MED ORDER — SODIUM CHLORIDE 0.9% FLUSH: 3.0000 mL | INTRAVENOUS | Status: DC | PRN

## 2020-03-25 MED ORDER — GABAPENTIN 100 MG PO CAPS
200.0000 mg | ORAL_CAPSULE | Freq: Three times a day (TID) | ORAL | Status: DC
  Administered 2020-03-25 – 2020-04-01 (×18): 200 mg via ORAL
  Filled 2020-03-25 (×20): qty 2

## 2020-03-25 MED ORDER — CIPROFLOXACIN HCL 500 MG PO TABS
500.0000 mg | ORAL_TABLET | Freq: Two times a day (BID) | ORAL | Status: DC
  Administered 2020-03-25 – 2020-03-27 (×4): 500 mg via ORAL
  Filled 2020-03-25 (×4): qty 1

## 2020-03-25 MED ORDER — TAMSULOSIN HCL 0.4 MG PO CAPS
0.4000 mg | ORAL_CAPSULE | Freq: Every day | ORAL | Status: DC
  Administered 2020-03-25 – 2020-03-31 (×7): 0.4 mg via ORAL
  Filled 2020-03-25 (×7): qty 1

## 2020-03-25 MED ORDER — SODIUM CHLORIDE 0.9 % IV SOLN: 250.0000 mL | INTRAVENOUS | Status: DC | PRN

## 2020-03-25 MED ORDER — QUETIAPINE FUMARATE 25 MG PO TABS
50.0000 mg | ORAL_TABLET | Freq: Every day | ORAL | Status: DC
  Administered 2020-03-25 – 2020-03-31 (×7): 50 mg via ORAL
  Filled 2020-03-25 (×7): qty 2

## 2020-03-25 MED ORDER — SODIUM CHLORIDE 0.9 % IV SOLN: INTRAVENOUS | Status: DC

## 2020-03-25 MED ORDER — ACETAMINOPHEN 325 MG PO TABS: 650.0000 mg | ORAL_TABLET | ORAL | Status: DC | PRN

## 2020-03-25 MED ORDER — HEPARIN (PORCINE) IN NACL 1000-0.9 UT/500ML-% IV SOLN
INTRAVENOUS | Status: DC | PRN
  Administered 2020-03-25 (×2): 500 mL

## 2020-03-25 MED ORDER — MIDAZOLAM HCL 2 MG/2ML IJ SOLN
INTRAMUSCULAR | Status: AC
  Filled 2020-03-25: qty 2

## 2020-03-25 MED ORDER — SODIUM CHLORIDE 0.9 % WEIGHT BASED INFUSION: 1.0000 mL/kg/h | INTRAVENOUS | Status: AC

## 2020-03-25 MED ORDER — HEPARIN SODIUM (PORCINE) 5000 UNIT/ML IJ SOLN
5000.0000 [IU] | Freq: Three times a day (TID) | INTRAMUSCULAR | Status: DC
  Administered 2020-03-25 – 2020-03-29 (×11): 5000 [IU] via SUBCUTANEOUS
  Filled 2020-03-25 (×10): qty 1

## 2020-03-25 MED ORDER — SODIUM CHLORIDE 0.9 % WEIGHT BASED INFUSION
1.0000 mL/kg/h | INTRAVENOUS | Status: AC
  Administered 2020-03-25: 1 mL/kg/h via INTRAVENOUS

## 2020-03-25 MED ORDER — ATORVASTATIN CALCIUM 10 MG PO TABS
20.0000 mg | ORAL_TABLET | Freq: Every day | ORAL | Status: DC
  Administered 2020-03-25 – 2020-04-01 (×8): 20 mg via ORAL
  Filled 2020-03-25 (×8): qty 2

## 2020-03-25 MED ORDER — METOPROLOL TARTRATE 12.5 MG HALF TABLET
12.5000 mg | ORAL_TABLET | Freq: Every day | ORAL | Status: DC
  Administered 2020-03-25 – 2020-04-01 (×8): 12.5 mg via ORAL
  Filled 2020-03-25 (×8): qty 1

## 2020-03-25 MED ORDER — LORAZEPAM 0.5 MG PO TABS: 0.5000 mg | ORAL_TABLET | Freq: Four times a day (QID) | ORAL | Status: DC | PRN

## 2020-03-25 MED ORDER — ACETAMINOPHEN 325 MG PO TABS: 650.0000 mg | ORAL_TABLET | Freq: Four times a day (QID) | ORAL | Status: DC | PRN

## 2020-03-25 MED ORDER — ASPIRIN EC 81 MG PO TBEC
81.0000 mg | DELAYED_RELEASE_TABLET | Freq: Every day | ORAL | Status: DC
  Administered 2020-03-25 – 2020-04-01 (×8): 81 mg via ORAL
  Filled 2020-03-25 (×8): qty 1

## 2020-03-25 MED ORDER — IODIXANOL 320 MG/ML IV SOLN
INTRAVENOUS | Status: DC | PRN
  Administered 2020-03-25: 97 mL via INTRA_ARTERIAL

## 2020-03-25 MED ORDER — LIDOCAINE HCL (PF) 1 % IJ SOLN
INTRAMUSCULAR | Status: AC
  Filled 2020-03-25: qty 30

## 2020-03-25 MED ORDER — MIDAZOLAM HCL 2 MG/2ML IJ SOLN
INTRAMUSCULAR | Status: DC | PRN
  Administered 2020-03-25: 0.5 mg via INTRAVENOUS

## 2020-03-25 MED ORDER — SODIUM CHLORIDE 0.9% FLUSH
3.0000 mL | Freq: Two times a day (BID) | INTRAVENOUS | Status: DC
  Administered 2020-03-26 – 2020-03-31 (×11): 3 mL via INTRAVENOUS

## 2020-03-25 MED ORDER — LABETALOL HCL 5 MG/ML IV SOLN: 10.0000 mg | INTRAVENOUS | Status: DC | PRN

## 2020-03-25 MED ORDER — FENTANYL CITRATE (PF) 100 MCG/2ML IJ SOLN
INTRAMUSCULAR | Status: DC | PRN
  Administered 2020-03-25 (×3): 12.5 ug via INTRAVENOUS

## 2020-03-25 MED ORDER — SODIUM CHLORIDE 0.9% FLUSH
3.0000 mL | Freq: Two times a day (BID) | INTRAVENOUS | Status: DC
  Administered 2020-03-26 – 2020-03-30 (×5): 3 mL via INTRAVENOUS

## 2020-03-25 MED ORDER — INSULIN DETEMIR 100 UNIT/ML ~~LOC~~ SOLN
14.0000 [IU] | Freq: Every day | SUBCUTANEOUS | Status: DC
  Administered 2020-03-26 – 2020-04-01 (×7): 14 [IU] via SUBCUTANEOUS
  Filled 2020-03-25 (×7): qty 0.14

## 2020-03-25 MED ORDER — LIDOCAINE HCL (PF) 1 % IJ SOLN
INTRAMUSCULAR | Status: DC | PRN
  Administered 2020-03-25: 18 mL via INTRADERMAL

## 2020-03-25 MED ORDER — SODIUM CHLORIDE 0.9 % IV SOLN
INTRAVENOUS | Status: AC | PRN
  Administered 2020-03-25: 75 mL/h via INTRAVENOUS

## 2020-03-25 MED ORDER — FAMOTIDINE 20 MG PO TABS
20.0000 mg | ORAL_TABLET | Freq: Every day | ORAL | Status: DC
  Administered 2020-03-26 – 2020-04-01 (×7): 20 mg via ORAL
  Filled 2020-03-25 (×7): qty 1

## 2020-03-25 MED ORDER — ONDANSETRON HCL 4 MG/2ML IJ SOLN: 4.0000 mg | Freq: Four times a day (QID) | INTRAMUSCULAR | Status: DC | PRN

## 2020-03-25 MED ORDER — INSULIN ASPART 100 UNIT/ML ~~LOC~~ SOLN
0.0000 [IU] | Freq: Three times a day (TID) | SUBCUTANEOUS | Status: DC
  Administered 2020-03-26: 3 [IU] via SUBCUTANEOUS
  Administered 2020-03-27 – 2020-03-28 (×3): 5 [IU] via SUBCUTANEOUS
  Administered 2020-03-28: 11 [IU] via SUBCUTANEOUS
  Administered 2020-03-29: 5 [IU] via SUBCUTANEOUS
  Administered 2020-03-29 – 2020-03-30 (×3): 3 [IU] via SUBCUTANEOUS
  Administered 2020-03-30: 5 [IU] via SUBCUTANEOUS
  Administered 2020-03-31: 3 [IU] via SUBCUTANEOUS
  Administered 2020-03-31: 2 [IU] via SUBCUTANEOUS
  Administered 2020-04-01: 5 [IU] via SUBCUTANEOUS
  Administered 2020-04-01: 2 [IU] via SUBCUTANEOUS

## 2020-03-25 MED ORDER — HEPARIN (PORCINE) IN NACL 1000-0.9 UT/500ML-% IV SOLN
INTRAVENOUS | Status: AC
  Filled 2020-03-25: qty 1000

## 2020-03-25 MED ORDER — HYDRALAZINE HCL 20 MG/ML IJ SOLN: 5.0000 mg | INTRAMUSCULAR | Status: DC | PRN

## 2020-03-25 SURGICAL SUPPLY — 9 items
CATH ANGIO 5F PIGTAIL 65CM (CATHETERS) ×2 IMPLANT
KIT MICROPUNCTURE NIT STIFF (SHEATH) ×2 IMPLANT
KIT PV (KITS) ×2 IMPLANT
SHEATH PINNACLE 5F 10CM (SHEATH) ×2 IMPLANT
SHEATH PROBE COVER 6X72 (BAG) ×2 IMPLANT
SYR MEDRAD MARK V 150ML (SYRINGE) ×2 IMPLANT
TRANSDUCER W/STOPCOCK (MISCELLANEOUS) ×2 IMPLANT
TRAY PV CATH (CUSTOM PROCEDURE TRAY) ×2 IMPLANT
WIRE BENTSON .035X145CM (WIRE) ×2 IMPLANT

## 2020-03-25 NOTE — Discharge Planning (Signed)
RNCM consulted regarding short stay staff having concerns sending pt back to [facility].  RNCM discussed case with EDSW who suggested making Adult Protective Services report.  RNCM relayed APS number for [facility] [phone]) to staff Darreld Mclean and Jasmine December).  RNCM remained in short stay while  Jasmine December made report to APS.  APS suggested calling Department of Health and [employer] [phone].  RNCM remained in SS with staff as [facility] report made.

## 2020-03-25 NOTE — Progress Notes (Signed)
Called pt daughter [name] to get consent for her mothers procedure, she states that she didn't;'t know anything about the procedure. Message sent to Dr Edilia Bo.

## 2020-03-25 NOTE — Progress Notes (Signed)
Patient to room 4E16 from Cath lab. Vital signs obtained. On monitor CCMD notified. CHG bath completed. R groin site level 0. Stage 1 on sacral. Patient alert to self. Call bell within reach.  Ginette Otto, RN

## 2020-03-25 NOTE — Progress Notes (Addendum)
Allergies reviewed with paperwork from [facility] and [employer]. Pt able to state her name. Confusion noted to time, place and situation. Unable to tell me why she is here, where she is, or what her birthday is.  Bruise noted to right eye and right side.

## 2020-03-25 NOTE — Progress Notes (Signed)
Report given to [facility] on 4E.

## 2020-03-25 NOTE — Progress Notes (Signed)
Discharge instructions given to [name] at [facility] voices understanding.

## 2020-03-25 NOTE — Interval H&P Note (Signed)
History and Physical Interval Note:  03/25/2020 8:19 AM  [name]  has presented today for surgery, with the diagnosis of atherosclorsis ulcer.  The various methods of treatment have been discussed with the patient and family. After consideration of risks, benefits and other options for treatment, the patient has consented to  Procedure(s): ABDOMINAL AORTOGRAM W/LOWER EXTREMITY (Bilateral) as a surgical intervention.  The patient's history has been reviewed, patient examined, no change in status, stable for surgery.  I have reviewed the patient's chart and labs.  Questions were answered to the patient's satisfaction.     Waverly Ferrari

## 2020-03-25 NOTE — Progress Notes (Signed)
Urinalysis added to urine culture order for APS.

## 2020-03-25 NOTE — Progress Notes (Signed)
Case management to bedside, APS called and informed of pt's condition. APS unable to help. Burr Medico, CNA spoke with [facility] and gave a detailed report.  Urinalysis results came back (results in chart), findings reported to Nigel Mormon to inform Dr. Edilia Bo.

## 2020-03-25 NOTE — Progress Notes (Signed)
Telephone consent  obtained from pt daughter

## 2020-03-25 NOTE — Progress Notes (Signed)
Message left for her daughter [name] to inform her that Ms Sigala will be d/c at 1330 and taken back to [facility]. [facility] called and informed that pt will be ready to d/c at 1330. States they will call us once they get here.

## 2020-03-25 NOTE — Discharge Instructions (Signed)
Femoral Site Care This sheet gives you information about how to care for yourself after your procedure. Your health care provider may also give you more specific instructions. If you have problems or questions, contact your health care provider. What can I expect after the procedure? After the procedure, it is common to have:  Bruising that usually fades within 1-2 weeks.  Tenderness at the site. Follow these instructions at home: Wound care  Follow instructions from your health care provider about how to take care of your insertion site. Make sure you: ? Wash your hands with soap and water before you change your bandage (dressing). If soap and water are not available, use hand sanitizer. ? Change your dressing as told by your health care provider. ? Leave stitches (sutures), skin glue, or adhesive strips in place. These skin closures may need to stay in place for 2 weeks or longer. If adhesive strip edges start to loosen and curl up, you may trim the loose edges. Do not remove adhesive strips completely unless your health care provider tells you to do that.  Do not take baths, swim, or use a hot tub until your health care provider approves.  You may shower 24-48 hours after the procedure or as told by your health care provider. ? Gently wash the site with plain soap and water. ? Pat the area dry with a clean towel. ? Do not rub the site. This may cause bleeding.  Do not apply powder or lotion to the site. Keep the site clean and dry.  Check your femoral site every day for signs of infection. Check for: ? Redness, swelling, or pain. ? Fluid or blood. ? Warmth. ? Pus or a bad smell. Activity  For the first 2-3 days after your procedure, or as long as directed: ? Avoid climbing stairs as much as possible. ? Do not squat.  Do not lift anything that is heavier than 10 lb (4.5 kg), or the limit that you are told, until your health care provider says that it is safe.  Rest as  directed. ? Avoid sitting for a long time without moving. Get up to take short walks every 1-2 hours.  Do not drive for 24 hours if you were given a medicine to help you relax (sedative). General instructions  Take over-the-counter and prescription medicines only as told by your health care provider.  Keep all follow-up visits as told by your health care provider. This is important. Contact a health care provider if you have:  A fever or chills.  You have redness, swelling, or pain around your insertion site. Get help right away if:  The catheter insertion area swells very fast.  You pass out.  You suddenly start to sweat or your skin gets clammy.  The catheter insertion area is bleeding, and the bleeding does not stop when you hold steady pressure on the area.  The area near or just beyond the catheter insertion site becomes pale, cool, tingly, or numb. These symptoms may represent a serious problem that is an emergency. Do not wait to see if the symptoms will go away. Get medical help right away. Call your local emergency services (911 in the [place] Do not drive yourself to the hospital. Summary  After the procedure, it is common to have bruising that usually fades within 1-2 weeks.  Check your femoral site every day for signs of infection.  Do not lift anything that is heavier than 10 lb (4.5 kg), or the   limit that you are told, until your health care provider says that it is safe. This information is not intended to replace advice given to you by your health care provider. Make sure you discuss any questions you have with your health care provider. Document Revised: 09/16/2017 Document Reviewed: 09/16/2017 Elsevier Patient Education  2020 [employer]  

## 2020-03-25 NOTE — Progress Notes (Addendum)
Foley cath with large amt of sediment urine unable to flow. Dr Edilia Bo called and gave order to change Foley. Peri care given. Peri area red with thick bloody/white  drainage noted peri area with swelling noted . New foley placed with sterile tech with two person technique , urine speci sent

## 2020-03-25 NOTE — Op Note (Signed)
   PATIENT: [name]      MRN: [id number] DOB: 1946/06/14    DATE OF PROCEDURE: 03/25/2020  INDICATIONS:    Taelar Gronewold is a 74 y.o. female who presented with nonhealing wounds of both feet.  She had evidence of multilevel arterial occlusive disease and presents for arteriography and possible intervention.  PROCEDURE:    1. Ultrasound-guided access to right common femoral artery 2. Aortogram with bilateral iliac arteriogram and bilateral lower extremity runoff 3. Conscious sedation  SURGEON: Di Kindle. Edilia Bo, MD, FACS  ANESTHESIA: Local with sedation  EBL: Minimal  TECHNIQUE: The patient was brought to the peripheral vascular lab and was sedated. The period of conscious sedation was 23 minutes.  During that time period, I was present face-to-face 100% of the time.  The patient was administered 37.5 mg of fentanyl and 0.5 mg of Versed. The patient's heart rate, blood pressure, and oxygen saturation were monitored by the nurse continuously during the procedure.  Both groins were prepped and draped in the usual sterile fashion.  Under ultrasound guidance, after the skin was anesthetized, I cannulated the right common femoral artery with a micropuncture needle and a micropuncture sheath was introduced over a wire.  This was exchanged for a 5 [place] sheath over a Bentson wire.  By ultrasound the femoral artery was patent. A real-time image was obtained and sent to the server.  The pigtail catheter was positioned at the L1 vertebral body. Flush aortogram was obtained. The catheter was in position above the aortic bifurcation and bilateral lower extremity runoff films were obtained.  FINDINGS:   RENALS/ AORTA: There are single renal arteries bilaterally with no significant renal artery stenosis identified. The infrarenal aorta is widely patent.  RIGHT INFLOW: The right common iliac artery, external iliac artery and hypogastric arteries are widely patent.  RIGHT LOWER  EXTREMITY RUNOFF: The common femoral and deep femoral artery are patent. The deep femoral artery is small. There is severe diffuse disease of the superficial femoral artery proximally which is occluded in the mid thigh. There is reconstitution of a diseased popliteal artery above the knee. The below-knee popliteal artery is patent. There is three-vessel runoff on the right via the anterior tibial, posterior tibial, and peroneal arteries which are all small.  LEFT INFLOW: The left common iliac artery, external iliac artery, and hypogastric arteries are patent.  LEFT LOWER EXTREMITY RUNOFF: The common femoral artery is occluded on the left. There is reconstitution of a small deep femoral artery. There is reconstitution of the superficial femoral artery distally which has moderate to severe disease. The above-knee popliteal artery is diseased. The below-knee popliteal artery is patent. There is three-vessel runoff on the left via the anterior tibial, posterior tibial, and peroneal arteries. These vessels are all small.  CLINICAL NOTE: The patient is not a candidate for bypass. She was not a candidate for an endovascular approach. I have discussed the situation with her daughter. If the wounds progressed the options would be amputations bilaterally versus palliative care.   Waverly Ferrari, MD, FACS [facility]  DATE OF DICTATION:   03/25/2020

## 2020-03-26 DIAGNOSIS — I70203 Unspecified atherosclerosis of native arteries of extremities, bilateral legs: Secondary | ICD-10-CM | POA: Diagnosis not present

## 2020-03-26 DIAGNOSIS — L8990 Pressure ulcer of unspecified site, unspecified stage: Secondary | ICD-10-CM | POA: Insufficient documentation

## 2020-03-26 LAB — GLUCOSE, CAPILLARY
Glucose-Capillary: 182 mg/dL — ABNORMAL HIGH (ref 70–99)
Glucose-Capillary: 116 mg/dL — ABNORMAL HIGH (ref 70–99)
Glucose-Capillary: 92 mg/dL (ref 70–99)
Glucose-Capillary: 138 mg/dL — ABNORMAL HIGH (ref 70–99)

## 2020-03-26 MED ORDER — CHLORHEXIDINE GLUCONATE CLOTH 2 % EX PADS
6.0000 | MEDICATED_PAD | Freq: Every day | CUTANEOUS | Status: DC
  Administered 2020-03-26 – 2020-04-01 (×7): 6 via TOPICAL
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
## 2020-03-27 DIAGNOSIS — Z881 Allergy status to other antibiotic agents status: Secondary | ICD-10-CM | POA: Diagnosis not present

## 2020-03-27 DIAGNOSIS — I70203 Unspecified atherosclerosis of native arteries of extremities, bilateral legs: Secondary | ICD-10-CM | POA: Diagnosis not present

## 2020-03-27 DIAGNOSIS — R29709 NIHSS score 9: Secondary | ICD-10-CM | POA: Diagnosis present

## 2020-03-27 DIAGNOSIS — I69351 Hemiplegia and hemiparesis following cerebral infarction affecting right dominant side: Secondary | ICD-10-CM | POA: Diagnosis not present

## 2020-03-27 DIAGNOSIS — Z933 Colostomy status: Secondary | ICD-10-CM | POA: Diagnosis not present

## 2020-03-27 DIAGNOSIS — N183 Chronic kidney disease, stage 3 unspecified: Secondary | ICD-10-CM | POA: Diagnosis present

## 2020-03-27 DIAGNOSIS — B964 Proteus (mirabilis) (morganii) as the cause of diseases classified elsewhere: Secondary | ICD-10-CM | POA: Diagnosis present

## 2020-03-27 DIAGNOSIS — I6330 Cerebral infarction due to thrombosis of unspecified cerebral artery: Secondary | ICD-10-CM | POA: Diagnosis not present

## 2020-03-27 DIAGNOSIS — I480 Paroxysmal atrial fibrillation: Secondary | ICD-10-CM | POA: Diagnosis present

## 2020-03-27 DIAGNOSIS — I70269 Atherosclerosis of native arteries of extremities with gangrene, unspecified extremity: Secondary | ICD-10-CM | POA: Diagnosis not present

## 2020-03-27 DIAGNOSIS — Z885 Allergy status to narcotic agent status: Secondary | ICD-10-CM | POA: Diagnosis not present

## 2020-03-27 DIAGNOSIS — N390 Urinary tract infection, site not specified: Secondary | ICD-10-CM | POA: Diagnosis present

## 2020-03-27 DIAGNOSIS — S91302A Unspecified open wound, left foot, initial encounter: Secondary | ICD-10-CM | POA: Diagnosis present

## 2020-03-27 DIAGNOSIS — E1122 Type 2 diabetes mellitus with diabetic chronic kidney disease: Secondary | ICD-10-CM | POA: Diagnosis present

## 2020-03-27 DIAGNOSIS — Z20822 Contact with and (suspected) exposure to covid-19: Secondary | ICD-10-CM | POA: Diagnosis present

## 2020-03-27 DIAGNOSIS — G459 Transient cerebral ischemic attack, unspecified: Secondary | ICD-10-CM | POA: Diagnosis present

## 2020-03-27 DIAGNOSIS — Z79899 Other long term (current) drug therapy: Secondary | ICD-10-CM | POA: Diagnosis not present

## 2020-03-27 DIAGNOSIS — I9589 Other hypotension: Secondary | ICD-10-CM | POA: Diagnosis not present

## 2020-03-27 DIAGNOSIS — I70229 Atherosclerosis of native arteries of extremities with rest pain, unspecified extremity: Secondary | ICD-10-CM | POA: Diagnosis present

## 2020-03-27 DIAGNOSIS — R29810 Facial weakness: Secondary | ICD-10-CM | POA: Diagnosis present

## 2020-03-27 DIAGNOSIS — I5022 Chronic systolic (congestive) heart failure: Secondary | ICD-10-CM | POA: Diagnosis present

## 2020-03-27 DIAGNOSIS — S91301A Unspecified open wound, right foot, initial encounter: Secondary | ICD-10-CM | POA: Diagnosis present

## 2020-03-27 DIAGNOSIS — E785 Hyperlipidemia, unspecified: Secondary | ICD-10-CM | POA: Diagnosis present

## 2020-03-27 DIAGNOSIS — I340 Nonrheumatic mitral (valve) insufficiency: Secondary | ICD-10-CM | POA: Diagnosis not present

## 2020-03-27 DIAGNOSIS — I2510 Atherosclerotic heart disease of native coronary artery without angina pectoris: Secondary | ICD-10-CM | POA: Diagnosis present

## 2020-03-27 DIAGNOSIS — F0150 Vascular dementia without behavioral disturbance: Secondary | ICD-10-CM | POA: Diagnosis present

## 2020-03-27 DIAGNOSIS — E1151 Type 2 diabetes mellitus with diabetic peripheral angiopathy without gangrene: Secondary | ICD-10-CM | POA: Diagnosis present

## 2020-03-27 DIAGNOSIS — Z882 Allergy status to sulfonamides status: Secondary | ICD-10-CM | POA: Diagnosis not present

## 2020-03-27 DIAGNOSIS — I739 Peripheral vascular disease, unspecified: Secondary | ICD-10-CM | POA: Diagnosis not present

## 2020-03-27 DIAGNOSIS — I252 Old myocardial infarction: Secondary | ICD-10-CM | POA: Diagnosis not present

## 2020-03-27 LAB — GLUCOSE, CAPILLARY
Glucose-Capillary: 87 mg/dL (ref 70–99)
Glucose-Capillary: 234 mg/dL — ABNORMAL HIGH (ref 70–99)
Glucose-Capillary: 200 mg/dL — ABNORMAL HIGH (ref 70–99)
Glucose-Capillary: 105 mg/dL — ABNORMAL HIGH (ref 70–99)

## 2020-03-27 LAB — URINE CULTURE: Culture: >=100000 — AB

## 2020-03-27 MED ORDER — AMOXICILLIN-POT CLAVULANATE 500-125 MG PO TABS
1.0000 | ORAL_TABLET | Freq: Two times a day (BID) | ORAL | Status: AC
  Administered 2020-03-27 – 2020-03-31 (×10): 500 mg via ORAL
  Filled 2020-03-27 (×12): qty 1

## 2020-03-27 NOTE — Progress Notes (Addendum)
  Progress Note    03/27/2020 8:15 AM 2 Days Post-Op  Subjective:  No complaints.  Resting comfortably.   Vitals:   03/27/20 0116 03/27/20 0504  BP: (!) 103/55 113/62  Pulse: 78 80  Resp: 18 20  Temp: 98.7 F (37.1 C) (!) 97.3 F (36.3 C)  SpO2: 93% 93%   Physical Exam: Lungs:  Non labored Incisions:  Groin cath site without palpable hematoma Extremities:  Feet symmetrically warm; dressing left in place Neurologic: baseline  CBC    Component Value Date/Time   WBC 6.0 03/25/2020 1828   RBC 3.42 (L) 03/25/2020 1828   HGB 8.4 (L) 03/25/2020 1828   HCT 29.1 (L) 03/25/2020 1828   PLT 232 03/25/2020 1828   MCV 85.1 03/25/2020 1828   MCH 24.6 (L) 03/25/2020 1828   MCHC 28.9 (L) 03/25/2020 1828   RDW 17.9 (H) 03/25/2020 1828   LYMPHSABS 2.2 03/16/2020 0240   MONOABS 0.7 03/16/2020 0240   EOSABS 0.3 03/16/2020 0240   BASOSABS 0.0 03/16/2020 0240    BMET    Component Value Date/Time   NA 142 03/25/2020 0806   K 4.5 03/25/2020 0806   CL 106 03/25/2020 0806   CO2 23 03/16/2020 0240   GLUCOSE 102 (H) 03/25/2020 0806   BUN 28 (H) 03/25/2020 0806   CREATININE 1.31 (H) 03/25/2020 1828   CALCIUM 8.2 (L) 03/16/2020 0240   GFRNONAA 41 (L) 03/25/2020 1828   GFRAA 47 (L) 03/25/2020 1828    INR No results found for: INR   Intake/Output Summary (Last 24 hours) at 03/27/2020 0815 Last data filed at 03/27/2020 0500 Gross per 24 hour  Intake 120 ml  Output 200 ml  Net -80 ml     Assessment/Plan:  74 y.o. female is s/p Aortogram 2 Days Post-Op   R groin cath site unremarkable TOC consulted for discharge planning D/c when SNF bed available   Emilie Rutter, PA-C [facility] [phone] 03/27/2020 8:15 AM  I agree with.  Have seen and evaluated patient. Continue abx for UTI.   Discharge  Durene Cal
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
## 2020-03-28 ENCOUNTER — Encounter (HOSPITAL_COMMUNITY): Payer: Self-pay | Admitting: Vascular Surgery

## 2020-03-28 LAB — GLUCOSE, CAPILLARY
Glucose-Capillary: 79 mg/dL (ref 70–99)
Glucose-Capillary: 250 mg/dL — ABNORMAL HIGH (ref 70–99)
Glucose-Capillary: 303 mg/dL — ABNORMAL HIGH (ref 70–99)
Glucose-Capillary: 181 mg/dL — ABNORMAL HIGH (ref 70–99)

## 2020-03-28 LAB — SARS CORONAVIRUS 2 (TAT 6-24 HRS): SARS Coronavirus 2: NEGATIVE

## 2020-03-28 MED ORDER — NITROGLYCERIN 0.4 MG SL SUBL: 0.4000 mg | SUBLINGUAL_TABLET | SUBLINGUAL | Status: DC | PRN

## 2020-03-28 MED FILL — Verapamil HCl IV Soln 2.5 MG/ML: INTRAVENOUS | Qty: 2 | Status: AC

## 2020-03-28 NOTE — TOC Initial Note (Signed)
Transition of Care [facility]) - Initial/Assessment Note    Patient Details  Name: [name] MRN: [id number] Date of Birth: 11/07/1945  [facility] of [facility] CM/SW Contact:    [name], LCSWA Phone Number: 03/28/2020, 10:16 AM  Clinical Narrative:                  CSW spoke with patient's daughter, [name], via phone. She confirmed the patient is from [facility] and states she wants patient to return there. [name] states she thought the patient had discharged on Friday. CSW explained she remains in the hospital but is expected to discharge today if her covid test results comes back in time today. CSW advised she will be contacted to confirm if she actually discharges from the hospital.   CSW contacted [facility]- they confirmed bed availability RN updated and covid test requested  Antony Blackbird, MSW, LCSWA Clinical Social Worker   Expected Discharge Plan: Skilled Nursing Facility Barriers to Discharge: Barriers Resolved   Patient Goals and CMS Choice        Expected Discharge Plan and Services Expected Discharge Plan: Skilled Nursing Facility In-house Referral: Clinical Social Work     Living arrangements for the past 2 months: Skilled Nursing Facility Expected Discharge Date: 03/25/20                                    Prior Living Arrangements/Services Living arrangements for the past 2 months: Skilled Nursing Facility Lives with:: Self, Facility Resident Patient language and need for interpreter reviewed:: No        Need for Family Participation in Patient Care: Yes (Comment) Care giver support system in place?: Yes (comment)   Criminal Activity/Legal Involvement Pertinent to Current Situation/Hospitalization: No - Comment as needed  Activities of Daily Living      Permission Sought/Granted Permission sought to share information with : Family Supports Permission granted to share information with : Yes, Verbal Permission  Granted  Share Information with NAME: [name]  Permission granted to share info w AGENCY: SNF  Permission granted to share info w Relationship: daughter  Permission granted to share info w Contact Information: [phone]  Emotional Assessment   Attitude/Demeanor/Rapport: Unable to Assess Affect (typically observed): Unable to Assess Orientation: : Oriented to Self Alcohol / Substance Use: Not Applicable Psych Involvement: No (comment)  Admission diagnosis:  PAD (peripheral artery disease) (HCC) [I73.9] Peripheral arterial disease (HCC) [I73.9] Patient Active Problem List   Diagnosis Date Noted  . Pressure injury of skin 03/26/2020  . 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 PAD (peripheral artery disease) (HCC) 03/25/2020  . Peripheral arterial disease (HCC) 03/25/2020   PCP:  Lindaann Pascal Pharmacy:  No Pharmacies Listed    Social Determinants of Health (SDOH) Interventions    Readmission Risk Interventions No flowsheet data found.

## 2020-03-28 NOTE — Progress Notes (Addendum)
  Progress Note    03/28/2020 7:20 AM 3 Days Post-Op  Subjective:  No complaints   Vitals:   03/27/20 2351 03/28/20 0401  BP: (!) 104/51 111/60  Pulse: 86 83  Resp: (!) 21 20  Temp: 99.5 F (37.5 C) 98.3 F (36.8 C)  SpO2: 90% 92%   Physical Exam: Lungs:  Non labored Incisions:  R groin cath site remains soft Extremities:  Dressings left in place BLE Neurologic: A&O  CBC    Component Value Date/Time   WBC 6.0 03/25/2020 1828   RBC 3.42 (L) 03/25/2020 1828   HGB 8.4 (L) 03/25/2020 1828   HCT 29.1 (L) 03/25/2020 1828   PLT 232 03/25/2020 1828   MCV 85.1 03/25/2020 1828   MCH 24.6 (L) 03/25/2020 1828   MCHC 28.9 (L) 03/25/2020 1828   RDW 17.9 (H) 03/25/2020 1828   LYMPHSABS 2.2 03/16/2020 0240   MONOABS 0.7 03/16/2020 0240   EOSABS 0.3 03/16/2020 0240   BASOSABS 0.0 03/16/2020 0240    BMET    Component Value Date/Time   NA 142 03/25/2020 0806   K 4.5 03/25/2020 0806   CL 106 03/25/2020 0806   CO2 23 03/16/2020 0240   GLUCOSE 102 (H) 03/25/2020 0806   BUN 28 (H) 03/25/2020 0806   CREATININE 1.31 (H) 03/25/2020 1828   CALCIUM 8.2 (L) 03/16/2020 0240   GFRNONAA 41 (L) 03/25/2020 1828   GFRAA 47 (L) 03/25/2020 1828    INR No results found for: INR   Intake/Output Summary (Last 24 hours) at 03/28/2020 0720 Last data filed at 03/27/2020 2204 Gross per 24 hour  Intake --  Output 275 ml  Net -275 ml     Assessment/Plan:  74 y.o. female is s/p Aortogram 3 Days Post-Op   Feet are warm and well perfused Pharmacy has adjusted antibiotics based on urine culture Ready for d/c when new SNF placement has been approved   Emilie Rutter, PA-C [facility] [phone] 03/28/2020 7:20 AM  I have interviewed the patient and examined the patient. I agree with the findings by the PA. Urine culture grew greater than 100,000 colonies of Proteus mirabilis.  The patient is on p.o. Augmentin. Awaiting placement at a skilled nursing  facility.  Cari Caraway, MD [phone]

## 2020-03-29 ENCOUNTER — Inpatient Hospital Stay (HOSPITAL_COMMUNITY): Payer: Medicare Other

## 2020-03-29 DIAGNOSIS — I6330 Cerebral infarction due to thrombosis of unspecified cerebral artery: Secondary | ICD-10-CM

## 2020-03-29 DIAGNOSIS — I70269 Atherosclerosis of native arteries of extremities with gangrene, unspecified extremity: Secondary | ICD-10-CM

## 2020-03-29 DIAGNOSIS — I739 Peripheral vascular disease, unspecified: Secondary | ICD-10-CM

## 2020-03-29 DIAGNOSIS — I340 Nonrheumatic mitral (valve) insufficiency: Secondary | ICD-10-CM

## 2020-03-29 LAB — CBC
HCT: 29 % — ABNORMAL LOW (ref 36.0–46.0)
Hemoglobin: 8.4 g/dL — ABNORMAL LOW (ref 12.0–15.0)
MCH: 24.5 pg — ABNORMAL LOW (ref 26.0–34.0)
MCHC: 29 g/dL — ABNORMAL LOW (ref 30.0–36.0)
MCV: 84.5 fL (ref 80.0–100.0)
Platelets: 230 10*3/uL (ref 150–400)
RBC: 3.43 MIL/uL — ABNORMAL LOW (ref 3.87–5.11)
RDW: 17.9 % — ABNORMAL HIGH (ref 11.5–15.5)
WBC: 6.8 10*3/uL (ref 4.0–10.5)
nRBC: 0 % (ref 0.0–0.2)

## 2020-03-29 LAB — BASIC METABOLIC PANEL
Anion gap: 7 (ref 5–15)
BUN: 22 mg/dL (ref 8–23)
CO2: 24 mmol/L (ref 22–32)
Calcium: 8.1 mg/dL — ABNORMAL LOW (ref 8.9–10.3)
Chloride: 107 mmol/L (ref 98–111)
Creatinine, Ser: 1.47 mg/dL — ABNORMAL HIGH (ref 0.44–1.00)
GFR calc Af Amer: 41 mL/min — ABNORMAL LOW (ref >60)
GFR calc non Af Amer: 35 mL/min — ABNORMAL LOW (ref >60)
Glucose, Bld: 186 mg/dL — ABNORMAL HIGH (ref 70–99)
Potassium: 4.3 mmol/L (ref 3.5–5.1)
Sodium: 138 mmol/L (ref 135–145)

## 2020-03-29 LAB — ECHOCARDIOGRAM COMPLETE
Height: 62 in
Weight: 2288 oz

## 2020-03-29 LAB — TROPONIN I (HIGH SENSITIVITY)
Troponin I (High Sensitivity): 57 ng/L — ABNORMAL HIGH (ref <18)
Troponin I (High Sensitivity): 79 ng/L — ABNORMAL HIGH (ref <18)

## 2020-03-29 LAB — APTT: aPTT: 34 seconds (ref 24–36)

## 2020-03-29 LAB — GLUCOSE, CAPILLARY
Glucose-Capillary: 211 mg/dL — ABNORMAL HIGH (ref 70–99)
Glucose-Capillary: 167 mg/dL — ABNORMAL HIGH (ref 70–99)
Glucose-Capillary: 206 mg/dL — ABNORMAL HIGH (ref 70–99)
Glucose-Capillary: 195 mg/dL — ABNORMAL HIGH (ref 70–99)
Glucose-Capillary: 234 mg/dL — ABNORMAL HIGH (ref 70–99)

## 2020-03-29 LAB — PROTIME-INR
INR: 1.2 (ref 0.8–1.2)
Prothrombin Time: 14.9 seconds (ref 11.4–15.2)

## 2020-03-29 MED ORDER — SODIUM CHLORIDE 0.9 % IV SOLN: INTRAVENOUS | Status: DC

## 2020-03-29 MED ORDER — AMOXICILLIN-POT CLAVULANATE 500-125 MG PO TABS: 1.0000 | ORAL_TABLET | Freq: Two times a day (BID) | ORAL | 0 refills | Status: DC

## 2020-03-29 MED ORDER — APIXABAN 5 MG PO TABS
5.0000 mg | ORAL_TABLET | Freq: Two times a day (BID) | ORAL | Status: DC
  Administered 2020-03-29: 5 mg via ORAL
  Filled 2020-03-29: qty 1

## 2020-03-29 MED ORDER — ADULT MULTIVITAMIN W/MINERALS CH
1.0000 | ORAL_TABLET | Freq: Every day | ORAL | Status: DC
  Administered 2020-03-29 – 2020-04-01 (×4): 1 via ORAL
  Filled 2020-03-29 (×4): qty 1

## 2020-03-29 NOTE — Progress Notes (Signed)
°  Echocardiogram 2D Echocardiogram has been performed.  [name] 03/29/2020, 4:51 PM

## 2020-03-29 NOTE — Progress Notes (Signed)
Paged Dr Darrick Penna to inform of the upward trending troponin. Informed that patient is resting; states he will round on patient this morning. No additional orders given.  De Burrs, RN

## 2020-03-29 NOTE — Progress Notes (Signed)
Patient complaint of severe chest pain level 9 with labored breathing. Patient placed on 2 L of Oxygen and EKG performed.  EKG unremarkable.   De Burrs, RN

## 2020-03-29 NOTE — Consult Note (Signed)
Stroke Neurology Consultation Note  Consult Requested by: Dr. Edilia Bo  Reason for Consult: code stroke  Consult Date: 03/29/20  The history was obtained from the RN and pharmacist. I tried to call pt daughter but it went to voice mail which was full and not able to leave message. Called [facility] PA Samantha and Dr. Arbie Cookey as well as talked with Dr. Myra Gianotti and was able to gather limited history.   History of Present Illness:  [name] is a 74 y.o. Caucasian female with PMH of PAF on Eliquis, CHF, CKD, dementia admitted for bilateral lower extremity nonhealing wound.  Had angiogram with Dr. Edilia Bo, and a deemed not candidate for bypass or endovascular procedure.  Decision made that if wounds progressed an option will be amputation bilaterally versus palliative care.  As per RN and Dr. Myra Gianotti, patient came from nursing home in wheelchair, wheeze bilateral unhealing wound, patient likely not ambulating at baseline.  Patient also has underlying dementia, at baseline not orientated to time or age, however able to carry basic conversation, able to talk and moving all extremities, able to feed herself.  This morning, patient was given medication including Eliquis 5 mg.  Right after medication given around 11:50 AM, patient sitting in bed, was found to have acute onset aphasia, not responding, slump backwards into bed with right arm weakness.  Code stroke called.  On arrival, laid pt flat to bed, BP 114/58.  Patient nonverbal, however follow simple commands, right facial droop, right arm slight drift, bilateral lower extremity symmetrical.  Stat CT no acute abnormality.  Not able to have IV access for CTA head and neck.  At CT suite, patient was able to speak words and able to repeat simple sentences, with improved language.  LSN: 11:50am tPA Given: No: Just had 1 dose of Eliquis before code stroke. mRS: 4   Past Medical History:  Diagnosis Date  . A-fib (HCC)   . Anemia   . CHF (congestive heart  failure) (HCC)   . Chronic kidney disease   . Colostomy present (HCC)   . Dementia (HCC)   . Diabetes mellitus without complication (HCC)   . Foley catheter in place   . Heart disease   . Hyperlipidemia   . Myocardial infarction (HCC)   . Pressure ulcer     Past Surgical History:  Procedure Laterality Date  . ABDOMINAL AORTOGRAM W/LOWER EXTREMITY Bilateral 03/25/2020   Procedure: ABDOMINAL AORTOGRAM W/LOWER EXTREMITY;  Surgeon: Chuck Hint, MD;  Location: [facility] INVASIVE CV LAB;  Service: Cardiovascular;  Laterality: Bilateral;  . COLOSTOMY      No family history on file.  Social History:  has an unknown smoking status. She has never used smokeless tobacco. She reports previous alcohol use. She reports previous drug use.  Allergies:  Allergies  Allergen Reactions  . Codeine   . Erythromycin   . Sulfa Antibiotics     No current facility-administered medications on file prior to encounter.   Current Outpatient Medications on File Prior to Encounter  Medication Sig Dispense Refill  . Amino Acids-Protein Hydrolys (FEEDING SUPPLEMENT, PRO-STAT SUGAR FREE 64,) LIQD Take 30 mLs by mouth daily with breakfast.    . Ascorbic Acid (VITAMIN C) 500 MG CHEW Chew 500 mg by mouth in the morning and at bedtime.     . Cholecalciferol (VITAMIN D) 50 MCG (2000 UT) tablet Take 2,000 Units by mouth daily.     Marland Kitchen gabapentin (NEURONTIN) 100 MG capsule Take 200 mg by mouth 3 (three)  times daily.    . insulin detemir (LEVEMIR) 100 UNIT/ML injection Inject 14 Units into the skin daily.     . insulin lispro (HUMALOG KWIKPEN) 100 UNIT/ML KwikPen Inject 0-10 Units into the skin 3 (three) times daily with meals. Dose per sliding scale. 0-150 = 0 units; 151-200 = 2 units; 201-250 = 4 units; 251-300 = 6 units; 301-350 = 8 units; 351-400 = 10 units; >400, notify MD.    . LORazepam (ATIVAN) 0.5 MG tablet Take 0.5 mg by mouth every 6 (six) hours as needed for anxiety (agitation).    . metoprolol tartrate  (LOPRESSOR) 25 MG tablet Take 12.5 mg by mouth daily.    . Multiple Vitamin (MULTIVITAMIN) capsule Take 1 capsule by mouth daily.    . QUEtiapine (SEROQUEL) 50 MG tablet Take 50 mg by mouth at bedtime.    Marland Kitchen. acetaminophen (TYLENOL) 500 MG tablet Take 1,000 mg by mouth in the morning and at bedtime.    Marland Kitchen. apixaban (ELIQUIS) 5 MG TABS tablet Take 5 mg by mouth 2 (two) times daily. (Patient not taking: Reported on 03/02/2020)    . aspirin EC 81 MG tablet Take 81 mg by mouth daily.    . famotidine (PEPCID) 20 MG tablet Take 20 mg by mouth daily with breakfast.     . polyethylene glycol (MIRALAX / GLYCOLAX) 17 g packet Take 17 g by mouth daily as needed (constipation).     . tamsulosin (FLOMAX) 0.4 MG CAPS capsule Take 0.4 mg by mouth at bedtime.     Marland Kitchen. zinc sulfate 220 (50 Zn) MG capsule Take 220 mg by mouth daily.      Review of Systems: A full ROS was attempted today and was able to be performed.  Systems assessed include - Constitutional, Eyes, HENT, Respiratory, Cardiovascular, Gastrointestinal, Genitourinary, Integument/breast, Hematologic/lymphatic, Musculoskeletal, Neurological, Behavioral/Psych, Endocrine, Allergic/Immunologic - with pertinent responses as per HPI.  Physical Examination: Temp:  [97.6 F (36.4 C)-98.9 F (37.2 C)] 97.7 F (36.5 C) (07/13 1203) Pulse Rate:  [79-113] 84 (07/13 1315) Resp:  [20-29] 23 (07/13 1315) BP: (112-139)/(49-80) 120/49 (07/13 1315) SpO2:  [91 %-100 %] 100 % (07/13 1203)  General - well nourished, well developed, lethargic.    Ophthalmologic - fundi not visualized due to noncooperation.    Cardiovascular - regular rhythm and rate.  Neuro - lethargic, awake, alert, eyes open.  Nonverbal, able to follow simple central and peripheral commands.  Not able to answer questions, name or repeat.  No gaze palsy, PERRL, blinking to visual threat bilaterally.  Right facial droop, tongue midline.  Bilateral upper extremity against gravity, with slight pronator  drift on the right.  Bilateral lower extremity 3/5 proximal and distal.  Sensation and coordination not corporative.  Gait not tested.  NIH Stroke Scale  Level Of Consciousness 0=Alert; keenly responsive 1=Arouse to minor stimulation 2=Requires repeated stimulation to arouse or movements to pain 3=postures or unresponsive 0  LOC Questions to Month and Age 63=Answers both questions correctly 1=Answers one question correctly or dysarthria/intubated/trauma/language barrier 2=Answers neither question correctly or aphasia 2  LOC Commands      -Open/Close eyes     -Open/close grip     -Pantomime commands if communication barrier 0=Performs both tasks correctly 1=Performs one task correctly 2=Performs neighter task correctly 0  Best Gaze     -Only assess horizontal gaze 0=Normal 1=Partial gaze palsy 2=Forced deviation, or total gaze paresis 0  Visual 0=No visual loss 1=Partial hemianopia 2=Complete hemianopia 3=Bilateral hemianopia (blind including cortical  blindness) 0  Facial Palsy     -Use grimace if obtunded 0=Normal symmetrical movement 1=Minor paralysis (asymmetry) 2=Partial paralysis (lower face) 3=Complete paralysis (upper and lower face) 2  Motor  0=No drift for 10/5 seconds 1=Drift, but does not hit bed 2=Some antigravity effort, hits  bed 3=No effort against gravity, limb falls 4=No movement 0=Amputation/joint fusion Right Arm 1     Leg 0    Left Arm 0     Leg 0  Limb Ataxia     - FNT/HTS 0=Absent or does not understand or paralyzed or amputation/joint fusion 1=Present in one limb 2=Present in two limbs 0  Sensory 0=Normal 1=Mild to moderate sensory loss 2=Severe to total sensory loss or coma/unresponsive 0  Best Language 0=No aphasia, normal 1=Mild to moderate aphasia 2=Severe aphasia 3=Mute, global aphasia, or coma/unresponsive 2  Dysarthria 0=Normal 1=Mild to moderate 2=Severe, unintelligible or mute/anarthric 0=intubated/unable to test 2   Extinction/Neglect 0=No abnormality 1=visual/tactile/auditory/spatia/personal inattention/Extinction to bilateral simultaneous stimulation 2=Profound neglect/extinction more than 1 modality  0  Total   9     Data Reviewed: CT Head Wo Contrast  Result Date: 03/16/2020 CLINICAL DATA:  Larey Seat, dementia, frontal scalp hematoma EXAM: CT HEAD WITHOUT CONTRAST TECHNIQUE: Contiguous axial images were obtained from the base of the skull through the vertex without intravenous contrast. COMPARISON:  None. FINDINGS: Brain: Hypodensities are seen throughout the bilateral periventricular white matter and bilateral basal ganglia compatible with age-indeterminate small vessel ischemic changes. Focal hypodensity in the left thalamus is consistent with a chronic lacunar infarct. No other signs of acute infarct or hemorrhage. Lateral ventricles and remaining midline structures are unremarkable. No acute extra-axial fluid collections. No mass effect. Vascular: No hyperdense vessel. There is severe atherosclerosis within the vertebral arteries and bilateral internal carotid arteries. Skull: Small right supraorbital scalp hematoma. No underlying fracture. The remainder of the calvarium is unremarkable. Sinuses/Orbits: No acute finding. Other: None. IMPRESSION: 1. Small right supraorbital scalp hematoma. 2. Age-indeterminate small vessel ischemic changes throughout the white matter and bilateral basal ganglia. Chronic left thalamic lacunar infarct. 3. No acute intracranial trauma. Electronically Signed   By: Sharlet Salina M.D.   On: 03/16/2020 03:46   CT Cervical Spine Wo Contrast  Result Date: 03/16/2020 CLINICAL DATA:  Found down, fell, frontal scalp hematoma EXAM: CT CERVICAL SPINE WITHOUT CONTRAST TECHNIQUE: Multidetector CT imaging of the cervical spine was performed without intravenous contrast. Multiplanar CT image reconstructions were also generated. COMPARISON:  None. FINDINGS: Alignment: Alignment is grossly  anatomic. Skull base and vertebrae: There is bony fusion across the C3/C4 disc space. No acute displaced fractures. Soft tissues and spinal canal: No prevertebral fluid or swelling. No visible canal hematoma. Severe atherosclerosis of the carotid vessels bilaterally. Disc levels: There is prominent spondylosis at C4-5, C5-6, and C6-7 there is mild diffuse facet hypertrophy is most pronounced from C3 through C5. Upper chest: Airway is patent.  Lung apices are clear. Other: Reconstructed images demonstrate no additional findings. IMPRESSION: Multilevel cervical spondylosis and facet hypertrophy. No acute fracture. Electronically Signed   By: Sharlet Salina M.D.   On: 03/16/2020 03:43   DG Pelvis Portable  Result Date: 03/16/2020 CLINICAL DATA:  Un witnessed fall, found down EXAM: PORTABLE PELVIS 1-2 VIEWS COMPARISON:  None. FINDINGS: Supine frontal view of the pelvis demonstrates intramedullary rod and proximal dynamic screw within the proximal right femur. The distal aspect of the intramedullary rod is excluded by collimation. No acute displaced fractures. Bilateral hip osteoarthritis is noted. The bones are diffusely osteopenic.  Soft tissues are unremarkable. IMPRESSION: 1. No acute displaced fracture. Electronically Signed   By: Sharlet Salina M.D.   On: 03/16/2020 03:41   PERIPHERAL VASCULAR CATHETERIZATION  Result Date: 03/25/2020 PATIENT: [name]      MRN: [id number] DOB: 06-08-1946    DATE OF PROCEDURE: 03/25/2020 INDICATIONS:  [name] is a 74 y.o. female who presented with nonhealing wounds of both feet.  She had evidence of multilevel arterial occlusive disease and presents for arteriography and possible intervention. PROCEDURE:  1. Ultrasound-guided access to right common femoral artery 2. Aortogram with bilateral iliac arteriogram and bilateral lower extremity runoff 3. Conscious sedation SURGEON: Di Kindle. Edilia Bo, MD, FACS ANESTHESIA: Local with sedation EBL: Minimal TECHNIQUE:  The patient was brought to the peripheral vascular lab and was sedated. The period of conscious sedation was 23 minutes.  During that time period, I was present face-to-face 100% of the time.  The patient was administered 37.5 mg of fentanyl and 0.5 mg of Versed. The patient's heart rate, blood pressure, and oxygen saturation were monitored by the nurse continuously during the procedure. Both groins were prepped and draped in the usual sterile fashion.  Under ultrasound guidance, after the skin was anesthetized, I cannulated the right common femoral artery with a micropuncture needle and a micropuncture sheath was introduced over a wire.  This was exchanged for a 5 [place] sheath over a Bentson wire.  By ultrasound the femoral artery was patent. A real-time image was obtained and sent to the server. The pigtail catheter was positioned at the L1 vertebral body. Flush aortogram was obtained. The catheter was in position above the aortic bifurcation and bilateral lower extremity runoff films were obtained. FINDINGS: RENALS/ AORTA: There are single renal arteries bilaterally with no significant renal artery stenosis identified. The infrarenal aorta is widely patent. RIGHT INFLOW: The right common iliac artery, external iliac artery and hypogastric arteries are widely patent. RIGHT LOWER EXTREMITY RUNOFF: The common femoral and deep femoral artery are patent. The deep femoral artery is small. There is severe diffuse disease of the superficial femoral artery proximally which is occluded in the mid thigh. There is reconstitution of a diseased popliteal artery above the knee. The below-knee popliteal artery is patent. There is three-vessel runoff on the right via the anterior tibial, posterior tibial, and peroneal arteries which are all small. LEFT INFLOW: The left common iliac artery, external iliac artery, and hypogastric arteries are patent. LEFT LOWER EXTREMITY RUNOFF: The common femoral artery is occluded on the left.  There is reconstitution of a small deep femoral artery. There is reconstitution of the superficial femoral artery distally which has moderate to severe disease. The above-knee popliteal artery is diseased. The below-knee popliteal artery is patent. There is three-vessel runoff on the left via the anterior tibial, posterior tibial, and peroneal arteries. These vessels are all small. CLINICAL NOTE: The patient is not a candidate for bypass. She was not a candidate for an endovascular approach. I have discussed the situation with her daughter. If the wounds progressed the options would be amputations bilaterally versus palliative care. Waverly Ferrari, MD, FACS [facility] DATE OF DICTATION:   03/25/2020   DG Chest Portable 1 View  Result Date: 03/16/2020 CLINICAL DATA:  Un witnessed fall, found down EXAM: PORTABLE CHEST 1 VIEW COMPARISON:  None. FINDINGS: Supine frontal view of the chest demonstrates marked enlargement of the cardiac silhouette. There is central vascular congestion with diffuse interstitial prominence. Patchy areas of consolidation at the  lung bases, left greater than right, may reflect atelectasis. Small right pleural effusion cannot be excluded. No pneumothorax on this supine evaluation. No displaced fracture. IMPRESSION: 1. Findings consistent with mild congestive heart failure. 2. Likely atelectasis at the lung bases, left greater than right. Electronically Signed   By: Sharlet Salina M.D.   On: 03/16/2020 03:40   CT HEAD CODE STROKE WO CONTRAST  Result Date: 03/29/2020 CLINICAL DATA:  Code stroke.  Weakness, leaning to right EXAM: CT HEAD WITHOUT CONTRAST TECHNIQUE: Contiguous axial images were obtained from the base of the skull through the vertex without intravenous contrast. COMPARISON:  03/16/2020 FINDINGS: Brain: There is no acute intracranial hemorrhage, mass effect, or edema. No new loss of gray-white differentiation. Likely chronic small vessel  infarct of the left thalamus is again identified. Additional patchy and confluent areas of hypoattenuation in the supratentorial white matter are nonspecific but probably reflects stable chronic microvascular ischemic changes. Prominence of the ventricles and sulci reflects stable parenchymal volume loss. Vascular: No hyperdense vessel. There is intracranial atherosclerotic calcification at the skull base. Skull: Unremarkable. Sinuses/Orbits: No acute abnormality Other: Mastoid air cells are clear. ASPECTS ([place] Stroke Program Early CT Score) - Ganglionic level infarction (caudate, lentiform nuclei, internal capsule, insula, M1-M3 cortex): 7 - Supraganglionic infarction (M4-M6 cortex): 3 Total score (0-10 with 10 being normal): 10 IMPRESSION: No acute intracranial hemorrhage or evidence of acute infarction. ASPECT score is 10. Stable chronic finding since recent prior study. These results were communicated to Dr. Roda Shutters at 1:28 pmon 7/13/2021by text page via the [facility] messaging system. Electronically Signed   By: Guadlupe Spanish M.D.   On: 03/29/2020 13:31    Assessment: 74 y.o. female with PMH of PAF on Eliquis, CHF, CKD, dementia admitted for bilateral lower extremity nonhealing wound. Baseline not ambulating with underlying dementia. Around 11:50 AM, patient had acute onset aphasia, with right facial droop and arm weakness. BP 114/58. Put her flat in bed. Stat CT no acute abnormality.  Not able to have IV access for CTA head and neck.  At CT suite, patient was able to speak words and able to repeat simple sentences, with improved language.   Patient not TPA candidate given had Eliquis this morning before code stroke.  Patient not a IR candidate given premorbid MRS 4.  Will recommend further stroke work-up with MRI, MRA and carotid Doppler and 2D echo.  Okay to continue aspirin, but will recommend to hold off Eliquis until MRI done and without large stroke. Recommend bedrest for today and maintenance IV  fluid  Plan: - HgbA1c, fasting lipid panel - MRI, MRA  of the brain without contrast - PT consult, OT consult, Speech consult - Echocardiogram - Carotid dopplers - Continue aspirin - Hold off Eliquis for now until MRI done and without large stroke to avoid any hemorrhagic conversion. - Risk factor modification - Telemetry monitoring - Neuro checks  - avoid low BP, recommend bedrest for today and maintenance IV fluid - will follow  Thank you for this consultation and allowing [place] to participate in the care of this patient.  Marvel Plan, MD PhD Stroke Neurology 03/29/2020 3:04 PM

## 2020-03-29 NOTE — TOC Progression Note (Signed)
Transition of Care [facility]) - Progression Note    Patient Details  Name: [name] MRN: [id number] Date of Birth: 07-13-1946  [facility] CM/SW Contact  Eduard Roux, [place] Phone Number: 03/29/2020, 5:17 PM  Clinical Narrative:     CSW left voice message patient will not admit to SNF today.  Antony Blackbird, MSW, LCSWA Clinical Social Worker   Expected Discharge Plan: Skilled Nursing Facility Barriers to Discharge: Barriers Resolved  Expected Discharge Plan and Services Expected Discharge Plan: Skilled Nursing Facility In-house Referral: Clinical Social Work     Living arrangements for the past 2 months: Skilled Nursing Facility Expected Discharge Date: 03/29/20                                     Social Determinants of Health (SDOH) Interventions    Readmission Risk Interventions No flowsheet data found.

## 2020-03-29 NOTE — Code Documentation (Signed)
Code Stroke Progress Note  Pt LKW at 1200 today. Upon RN reassessment at 1257 she noted pt to have right sided weakness and inability to speak. Dr. Roda Shutters was rounding on unit and RN requested him to assess pt. Code stroke was called at 1259. Upon evaluation, pt had an NIH of 7 notable for disorientation, right sided facial weakness, right arm and right leg drift and mild aphasia. CBG 206. CT completed. Symptoms appear to be improving. Pt is able to identify more objects, still unable to identify all objects. Pt states her age is 74 years old, which is what she has been answering this admission.   Plan: Per Dr. Roda Shutters, pt not a candidate for tPA due to being on anticoagulation medications. Q2h mNIHSS and VS checks. Handoff given to M. Domingo Cocking, Charity fundraiser.  RN to update primary provider regarding events listed above.

## 2020-03-29 NOTE — Progress Notes (Addendum)
[facility]  Subjective  - pt sleepy.  Apparently had some chest pain last night   Objective 117/62 84 97.6 F (36.4 C) (Oral) (!) 21 97%  Intake/Output Summary (Last 24 hours) at 03/29/2020 5597 Last data filed at 03/29/2020 0344 Gross per 24 hour  Intake 480 ml  Output 1300 ml  Net -820 ml   No hematoma groin Currently no chest pain or shortness of breath  Assessment/Planning: Stable for d/c from vascular standpoint Mild elevation of troponin with no EKG changes in pt with dementia and minimal overall function.  Will hold on Cardiology eval for now.  Would revisit if recurrent symptoms  Resume pre procedure Eliquis Continue augmentin for UTI stop on 7/15  Fabienne Bruns 03/29/2020 9:22 AM --  Laboratory Lab Results: No results for input(s): WBC, HGB, HCT, PLT in the last 72 hours. BMET No results for input(s): NA, K, CL, CO2, GLUCOSE, BUN, CREATININE, CALCIUM in the last 72 hours.  COAG No results found for: INR, PROTIME No results found for: PTT

## 2020-03-29 NOTE — Progress Notes (Signed)
Paged Dr. Fabienne Bruns to make aware of patient condition. Verbal order for  0.4 nitro glycerin PRN and order for Troponin cycle.  De Burrs, RN

## 2020-03-29 NOTE — Progress Notes (Signed)
Carotid duplex has been completed.   Preliminary results in CV Proc.   Blanch Media 03/29/2020 4:46 PM

## 2020-03-29 NOTE — Progress Notes (Signed)
Lab notification that troponin levels show a trend upward by more than 19.  De Burrs, RN

## 2020-03-29 NOTE — Discharge Summary (Addendum)
Discharge Summary    [name] 1946-03-29 74 y.o. female  [id number]  Admission Date: 03/25/2020  Discharge Date: 04/01/2020  Physician: Chuck Hintickson, Christopher S, *  Admission Diagnosis: PAD (peripheral artery disease) (HCC) [I73.9] Peripheral arterial disease (HCC) [I73.9]   HPI:   This is a 74 y.o. female patient has nonhealing wounds of both legs.  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  Without revascularization I think the wounds are unlikely to heal and she would ultimately require bilateral amputations.  This is a complicated situation and that the patient does have some underlying dementia and there is no family available.  I have done my best to explain the situation to the patient and I feel that it would be reasonable to proceed with arteriography to see if there are any options from an endovascular standpoint to improve the circulation and improve the chances of healing these wounds.  I do not think she would be a good surgical candidate.  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  Although her duplex scan suggested a common femoral artery occlusion on the right I believe that I can feel a pulse on the right perhaps above the occlusion.  On the left side I was unable to feel a femoral pulse although I had to examine her in the wheelchair which certainly limits my exam.  I have reviewed with the patient the indications for arteriography. In addition, I have reviewed the potential complications of arteriography including but not limited to: Bleeding, arterial injury, arterial thrombosis, dye action, renal insufficiency, or other unpredictable medical problems. I have explained to the patient that if we find disease amenable to angioplasty we could potentially address this at the same time. I have discussed the potential complications of angioplasty and stenting, including but not limited to: Bleeding, arterial thrombosis, arterial  injury, dissection, or the need for surgical intervention.  Her arteriogram is scheduled for 03/04/2020.  We will hold her Eliquis for 48 hours prior to the procedure.  I do not have any preoperative labs obviously we will have to check her renal function to be sure that she can receive contrast.  Otherwise she would require CO2.  Hospital Course:  The patient was admitted to the hospital and taken to the operating room on 03/25/2020 and underwent: 1. Ultrasound-guided access to right common femoral artery 2. Aortogram with bilateral iliac arteriogram and bilateral lower extremity runoff 3. Conscious sedation    Findings: RENALS/ AORTA: There are single renal arteries bilaterally with no significant renal artery stenosis identified. The infrarenal aorta is widely patent.  RIGHT INFLOW: The right common iliac artery, external iliac artery and hypogastric arteries are widely patent.  RIGHT LOWER EXTREMITY RUNOFF: The common femoral and deep femoral artery are patent. The deep femoral artery is small. There is severe diffuse disease of the superficial femoral artery proximally which is occluded in the mid thigh. There is reconstitution of a diseased popliteal artery above the knee. The below-knee popliteal artery is patent. There is three-vessel runoff on the right via the anterior tibial, posterior tibial, and peroneal arteries which are all small.  LEFT INFLOW: The left common iliac artery, external iliac artery, and hypogastric arteries are patent.  LEFT LOWER EXTREMITY RUNOFF: The common femoral artery is occluded on the left. There is reconstitution of a small deep femoral artery. There is reconstitution of the superficial femoral artery distally which has moderate to severe disease. The above-knee popliteal artery is diseased. The below-knee  popliteal artery is patent. There is three-vessel runoff on the left via the anterior tibial, posterior tibial, and peroneal arteries. These vessels are all  small.  CLINICAL NOTE: The patient is not a candidate for bypass. She was not a candidate for an endovascular approach. I have discussed the situation with her daughter. If the wounds progressed the options would be amputations bilaterally versus palliative care.  The pt tolerated the procedure well and was transported to the PACU in good condition.   Pt's abx were adjusted for her UTI to Augmentin for Proteus mirabilis.  Right groin cath site unremarkable.  She did have some chest pain without EKG changes but troponin did have Mild elevation of troponin with no EKG changes in pt with dementia and minimal overall function.  Will hold on Cardiology eval for now.  Would revisit if recurrent symptoms.  On 03/29/2020, pt's discharge was delayed due to neurologic event.  MR negative for CVA and Eliquis resumed.  Pt  Seemed to of had TIA and essentially negative work up.  Dr. Darrick Penna spoke with daughter about disposition.    7/15, Neurology has signed off after negative workup including MR brain, echo, and carotid duplex TOC team again working on SNF placement.  D/c when approved. Social work spoke with daughter and family ok to return to [facility].  Bed available on 7/16.  Augmentin not prescribed bc her abx course completed while in the hospital.    On 7/16, pt doing well without facial droop.  Grips are equal bilaterally.  Good capillary refill bilateral great toes.  Discharge back to SNF today.  The remainder of her hospital course was awaiting SNF placement.  Her covid test is negative.   CBC    Component Value Date/Time   WBC 6.8 03/29/2020 1410   RBC 3.43 (L) 03/29/2020 1410   HGB 8.4 (L) 03/29/2020 1410   HCT 29.0 (L) 03/29/2020 1410   PLT 230 03/29/2020 1410   MCV 84.5 03/29/2020 1410   MCH 24.5 (L) 03/29/2020 1410   MCHC 29.0 (L) 03/29/2020 1410   RDW 17.9 (H) 03/29/2020 1410   LYMPHSABS 2.2 03/16/2020 0240   MONOABS 0.7 03/16/2020 0240   EOSABS 0.3 03/16/2020 0240   BASOSABS  0.0 03/16/2020 0240    BMET    Component Value Date/Time   NA 138 03/29/2020 1410   K 4.3 03/29/2020 1410   CL 107 03/29/2020 1410   CO2 24 03/29/2020 1410   GLUCOSE 186 (H) 03/29/2020 1410   BUN 22 03/29/2020 1410   CREATININE 1.47 (H) 03/29/2020 1410   CALCIUM 8.1 (L) 03/29/2020 1410   GFRNONAA 35 (L) 03/29/2020 1410   GFRAA 41 (L) 03/29/2020 1410      Discharge Instructions    Discharge patient   Complete by: As directed    Discharge disposition: 03-Skilled Nursing Facility   Discharge patient date: 04/01/2020      Discharge Diagnosis:  PAD (peripheral artery disease) (HCC) [I73.9] Peripheral arterial disease (HCC) [I73.9]  Secondary Diagnosis: Patient Active Problem List   Diagnosis Date Noted  . Cerebral thrombosis with cerebral infarction 03/29/2020  . Pressure injury of skin 03/26/2020  . 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 PAD (peripheral artery disease) (HCC) 03/25/2020  . Peripheral arterial disease (HCC) 03/25/2020   Past Medical History:  Diagnosis Date  . A-fib (HCC)   . Anemia   . CHF (congestive heart failure) (HCC)   . Chronic kidney disease   . Colostomy  present (HCC)   . Dementia (HCC)   . Diabetes mellitus without complication (HCC)   . Foley catheter in place   . Heart disease   . Hyperlipidemia   . Myocardial infarction (HCC)   . Pressure ulcer      Allergies as of 04/01/2020      Reactions   Codeine    Erythromycin    Sulfa Antibiotics       Medication List    STOP taking these medications   cephALEXin 500 MG capsule Commonly known as: KEFLEX     TAKE these medications   acetaminophen 500 MG tablet Commonly known as: TYLENOL Take 1,000 mg by mouth in the morning and at bedtime.   aspirin EC 81 MG tablet Take 81 mg by mouth daily.   Eliquis 5 MG Tabs tablet Generic drug: apixaban Take 5 mg by mouth 2 (two) times daily.   famotidine 20 MG tablet Commonly known as: PEPCID Take  20 mg by mouth daily with breakfast.   feeding supplement (PRO-STAT SUGAR FREE 64) Liqd Take 30 mLs by mouth daily with breakfast.   gabapentin 100 MG capsule Commonly known as: NEURONTIN Take 200 mg by mouth 3 (three) times daily.   HumaLOG KwikPen 100 UNIT/ML KwikPen Generic drug: insulin lispro Inject 0-10 Units into the skin 3 (three) times daily with meals. Dose per sliding scale. 0-150 = 0 units; 151-200 = 2 units; 201-250 = 4 units; 251-300 = 6 units; 301-350 = 8 units; 351-400 = 10 units; >400, notify MD.   Levemir 100 UNIT/ML injection Generic drug: insulin detemir Inject 14 Units into the skin daily.   LORazepam 0.5 MG tablet Commonly known as: ATIVAN Take 0.5 mg by mouth every 6 (six) hours as needed for anxiety (agitation).   metoprolol tartrate 25 MG tablet Commonly known as: LOPRESSOR Take 12.5 mg by mouth daily.   multivitamin capsule Take 1 capsule by mouth daily.   polyethylene glycol 17 g packet Commonly known as: MIRALAX / GLYCOLAX Take 17 g by mouth daily as needed (constipation).   QUEtiapine 50 MG tablet Commonly known as: SEROQUEL Take 50 mg by mouth at bedtime.   tamsulosin 0.4 MG Caps capsule Commonly known as: FLOMAX Take 0.4 mg by mouth at bedtime.   Vitamin C 500 MG Chew Chew 500 mg by mouth in the morning and at bedtime.   Vitamin D 50 MCG (2000 UT) tablet Take 2,000 Units by mouth daily.   zinc sulfate 220 (50 Zn) MG capsule Take 220 mg by mouth daily.       Instructions:  [facility]  Discharge Instructions  Lower Extremity Angiogram; Angioplasty/Stenting  Please refer to the following instructions for your post-procedure care. Your surgeon or physician assistant will discuss any changes with you.  Activity  Avoid lifting more than 8 pounds (1 gallons of milk) for 72 hours (3 days) after your procedure. You may walk as much as you can tolerate. It's OK to drive after 72  hours.  Bathing/Showering  You may shower the day after your procedure. If you have a bandage, you may remove it at 24- 48 hours. Clean your incision site with mild soap and water. Pat the area dry with a clean towel.  Diet  Resume your pre-procedure diet. There are no special food restrictions following this procedure. All patients with peripheral vascular disease should follow a low fat/low cholesterol diet. In order to heal from your surgery, it is CRITICAL to get  adequate nutrition. Your body requires vitamins, minerals, and protein. Vegetables are the best source of vitamins and minerals. Vegetables also provide the perfect balance of protein. Processed food has little nutritional value, so try to avoid this.  Medications  Resume taking all of your medications unless your doctor tells you not to. If your incision is causing pain, you may take over-the-counter pain relievers such as acetaminophen (Tylenol)  Follow Up  Follow up will be arranged at the time of your procedure. You may have an office visit scheduled or may be scheduled for surgery. Ask your surgeon if you have any questions.  Please call us immediately for any of the following conditions: .Severe or worsening pain your legs or feet at rest or with walking. .Increased pain, redness, drainage at your groin puncture site. .Fever of 101 degrees or higher. .If you have any mild or slow bleeding from your puncture site: lie down, apply firm constant pressure over the area with a piece of gauze or a clean wash cloth for 30 minutes- no peeking!, call 911 right away if you are still bleeding after 30 minutes, or if the bleeding is heavy and unmanageable.  Reduce your risk factors of vascular disease:  . Stop smoking. If you would like help call [facility] at 1-800-QUIT-NOW (617-493-3584) or [place] at 585-868-7375. . Manage your cholesterol . Maintain a desired weight . Control your diabetes . Keep your blood pressure  down .  If you have any questions, please call the office at [phone]  New medications: 1.  none  Disposition: SNF  Patient's condition: is Limited  Follow up: 1. Dr. Edilia Bo as needed if wounds worsen   Doreatha Massed, PA-C [facility] [phone] 04/01/2020  8:48 AM

## 2020-03-30 DIAGNOSIS — G459 Transient cerebral ischemic attack, unspecified: Secondary | ICD-10-CM

## 2020-03-30 DIAGNOSIS — I9589 Other hypotension: Secondary | ICD-10-CM

## 2020-03-30 LAB — LIPID PANEL
Cholesterol: 80 mg/dL (ref 0–200)
HDL: 35 mg/dL — ABNORMAL LOW (ref >40)
LDL Cholesterol: 26 mg/dL (ref 0–99)
Total CHOL/HDL Ratio: 2.3 RATIO
Triglycerides: 94 mg/dL (ref <150)
VLDL: 19 mg/dL (ref 0–40)

## 2020-03-30 LAB — GLUCOSE, CAPILLARY
Glucose-Capillary: 189 mg/dL — ABNORMAL HIGH (ref 70–99)
Glucose-Capillary: 231 mg/dL — ABNORMAL HIGH (ref 70–99)
Glucose-Capillary: 197 mg/dL — ABNORMAL HIGH (ref 70–99)
Glucose-Capillary: 189 mg/dL — ABNORMAL HIGH (ref 70–99)

## 2020-03-30 LAB — HEMOGLOBIN A1C
Hgb A1c MFr Bld: 8.7 % — ABNORMAL HIGH (ref 4.8–5.6)
Mean Plasma Glucose: 202.99 mg/dL

## 2020-03-30 MED ORDER — APIXABAN 5 MG PO TABS
5.0000 mg | ORAL_TABLET | Freq: Two times a day (BID) | ORAL | Status: DC
  Administered 2020-03-30 – 2020-04-01 (×4): 5 mg via ORAL
  Filled 2020-03-30 (×4): qty 1

## 2020-03-30 NOTE — TOC Progression Note (Signed)
Transition of Care [facility]) - Progression Note    Patient Details  Name: [name] MRN: [id number] Date of Birth: 1946-08-14  [facility]) CM/SW Contact  Eduard Roux, [place] Phone Number: 03/30/2020, 4:13 PM  Clinical Narrative:     4:15pm-spoke with patient's daughter, gave bed offers. She will review and follow up with CSW.  8:58am-CSW received message from patient's daughter, [name], requesting to send out referrals to other SNFs. CSW faxed out referrals.   Antony Blackbird, MSW, LCSWA Clinical Social Worker   Expected Discharge Plan: Skilled Nursing Facility Barriers to Discharge: Barriers Resolved  Expected Discharge Plan and Services Expected Discharge Plan: Skilled Nursing Facility In-house Referral: Clinical Social Work     Living arrangements for the past 2 months: Skilled Nursing Facility Expected Discharge Date: 03/29/20                                     Social Determinants of Health (SDOH) Interventions    Readmission Risk Interventions No flowsheet data found.

## 2020-03-30 NOTE — Progress Notes (Signed)
STROKE TEAM PROGRESS NOTE   INTERVAL HISTORY No family is at bedside.  Patient much improved from yesterday, no facial droop, arm or leg symmetric muscle strength.  She knows her name, however not orientated to others.  Still has short of breath but no respirate distress.  MRI extremely limited but no gross infarct.  BP still 110s to 120s.  Recommend also static vital check.  Vitals:   03/29/20 2018 03/29/20 2327 03/30/20 0340 03/30/20 0750  BP: 118/65 (!) 112/54 (!) 107/53 125/88  Pulse: 100 95 86 90  Resp: (!) 25 20 18    Temp: 98.4 F (36.9 C) 98.3 F (36.8 C) 98.2 F (36.8 C) 98.6 F (37 C)  TempSrc: Oral Oral Oral Oral  SpO2: 92% 98% 96% 97%  Weight:      Height:       CBC:  Recent Labs  Lab 03/25/20 1828 03/29/20 1410  WBC 6.0 6.8  HGB 8.4* 8.4*  HCT 29.1* 29.0*  MCV 85.1 84.5  PLT 232 230   Basic Metabolic Panel:  Recent Labs  Lab 03/25/20 0806 03/25/20 0806 03/25/20 1828 03/29/20 1410  NA 142  --   --  138  K 4.5  --   --  4.3  CL 106  --   --  107  CO2  --   --   --  24  GLUCOSE 102*  --   --  186*  BUN 28*  --   --  22  CREATININE 1.40*   < > 1.31* 1.47*  CALCIUM  --   --   --  8.1*   < > = values in this interval not displayed.   Lipid Panel:  Recent Labs  Lab 03/30/20 0401  CHOL 80  TRIG 94  HDL 35*  CHOLHDL 2.3  VLDL 19  LDLCALC 26   HgbA1c:  Recent Labs  Lab 03/30/20 0401  HGBA1C 8.7*   Urine Drug Screen: No results for input(s): LABOPIA, COCAINSCRNUR, LABBENZ, AMPHETMU, THCU, LABBARB in the last 168 hours.  Alcohol Level No results for input(s): ETH in the last 168 hours.  IMAGING past 24 hours MR ANGIO HEAD WO CONTRAST  Result Date: 03/29/2020 CLINICAL DATA:  Follow-up examination for acute stroke. EXAM: MRI HEAD WITHOUT CONTRAST MRA HEAD WITHOUT CONTRAST TECHNIQUE: Multiplanar, multiecho pulse sequences of the brain and surrounding structures were obtained without intravenous contrast. Angiographic images of the head were obtained  using MRA technique without contrast. COMPARISON:  Prior head CT from earlier the same day. FINDINGS: MRI HEAD FINDINGS Brain: Examination severely limited as the patient was unable to tolerate the exam. An axial DWI sequence only was performed. Additionally, images provided are degraded by motion artifact. Diffusion weighted sequence demonstrates no evidence for acute or subacute infarct. No obvious areas of encephalomalacia to suggest chronic infarction. No appreciable mass lesion or mass effect. No midline shift or hydrocephalus. No visible extra-axial fluid collection. Vascular: Not assessed on this limited exam. Skull and upper cervical spine: Not assessed on this limited exam. Sinuses/Orbits: Not assessed on this limited exam. Other: None. MRA HEAD FINDINGS Dedicated MRA was not performed as the patient was unable to tolerate the exam. IMPRESSION: 1. Severely limited study with single axial DWI sequence only completed. MRA was not performed. Patient was unable to tolerate the exam. 2. No evidence for acute or subacute ischemia. No other obvious abnormality on this limited exam. Electronically Signed   By: 03/31/2020 M.D.   On: 03/29/2020 19:57  MR BRAIN WO CONTRAST  Result Date: 03/29/2020 CLINICAL DATA:  Follow-up examination for acute stroke. EXAM: MRI HEAD WITHOUT CONTRAST MRA HEAD WITHOUT CONTRAST TECHNIQUE: Multiplanar, multiecho pulse sequences of the brain and surrounding structures were obtained without intravenous contrast. Angiographic images of the head were obtained using MRA technique without contrast. COMPARISON:  Prior head CT from earlier the same day. FINDINGS: MRI HEAD FINDINGS Brain: Examination severely limited as the patient was unable to tolerate the exam. An axial DWI sequence only was performed. Additionally, images provided are degraded by motion artifact. Diffusion weighted sequence demonstrates no evidence for acute or subacute infarct. No obvious areas of  encephalomalacia to suggest chronic infarction. No appreciable mass lesion or mass effect. No midline shift or hydrocephalus. No visible extra-axial fluid collection. Vascular: Not assessed on this limited exam. Skull and upper cervical spine: Not assessed on this limited exam. Sinuses/Orbits: Not assessed on this limited exam. Other: None. MRA HEAD FINDINGS Dedicated MRA was not performed as the patient was unable to tolerate the exam. IMPRESSION: 1. Severely limited study with single axial DWI sequence only completed. MRA was not performed. Patient was unable to tolerate the exam. 2. No evidence for acute or subacute ischemia. No other obvious abnormality on this limited exam. Electronically Signed   By: Rise MuBenjamin  McClintock M.D.   On: 03/29/2020 19:57   ECHOCARDIOGRAM COMPLETE  Result Date: 03/29/2020    ECHOCARDIOGRAM REPORT   Patient Name:   Lorin PicketHERESA Magouirk Date of Exam: 03/29/2020 Medical Rec #:  [id number]        Height:       62.0 in Accession #:    [id number]       Weight:       143.0 lb Date of Birth:  1945/10/24       BSA:          1.658 m Patient Age:    73 years         BP:           99/45 mmHg Patient Gender: F                HR:           72 bpm. Exam Location:  Inpatient Procedure: 2D Echo Indications:    Stroke  History:        Patient has no prior history of Echocardiogram examinations.                 Arrythmias:Atrial Fibrillation; Risk Factors:Dyslipidemia and                 Diabetes.  Sonographer:    Thurman Coyerasey Kirkpatrick RDCS (AE) Referring Phys: [id number] Marvel PlanJINDONG Chrisette Man IMPRESSIONS  1. Multiple areas of hypokinesis, near global. Inferior and septal walls with near akinesis. Anterolateral wall thickens the best but is still mildly hypokinetic.Marland Kitchen. Left ventricular ejection fraction, by estimation, is 30 to 35%. The left ventricle has moderately decreased function. The left ventricle demonstrates global hypokinesis. The left ventricular internal cavity size was mildly dilated. Left ventricular  diastolic parameters are consistent with Grade II diastolic dysfunction (pseudonormalization).  2. Right ventricular systolic function is normal. The right ventricular size is normal. There is mildly elevated pulmonary artery systolic pressure.  3. Left atrial size was severely dilated.  4. Circumferential effusion, max diameter 1.9 cm. There is RA diastolic collapse, but no clear RV collapse. MV/TV inflow velocities do not suggest tamponade. Recommend clinical correlation.. Moderate pericardial effusion. The pericardial effusion is circumferential.  5. The  mitral valve is normal in structure. Mild mitral valve regurgitation. No evidence of mitral stenosis.  6. The aortic valve is tricuspid. Aortic valve regurgitation is not visualized. No aortic stenosis is present.  7. The inferior vena cava is normal in size with <50% respiratory variability, suggesting right atrial pressure of 8 mmHg. Comparison(s): No prior Echocardiogram. Conclusion(s)/Recommendation(s): No intracardiac source of embolism detected on this transthoracic study. A transesophageal echocardiogram is recommended to exclude cardiac source of embolism if clinically indicated. FINDINGS  Left Ventricle: Multiple areas of hypokinesis, near global. Inferior and septal walls with near akinesis. Anterolateral wall thickens the best but is still mildly hypokinetic. Left ventricular ejection fraction, by estimation, is 30 to 35%. The left ventricle has moderately decreased function. The left ventricle demonstrates global hypokinesis. The left ventricular internal cavity size was mildly dilated. There is borderline concentric left ventricular hypertrophy. Left ventricular diastolic parameters are consistent with Grade II diastolic dysfunction (pseudonormalization). Right Ventricle: The right ventricular size is normal. No increase in right ventricular wall thickness. Right ventricular systolic function is normal. There is mildly elevated pulmonary artery  systolic pressure. The tricuspid regurgitant velocity is 2.78  m/s, and with an assumed right atrial pressure of 8 mmHg, the estimated right ventricular systolic pressure is 38.9 mmHg. Left Atrium: Left atrial size was severely dilated. Right Atrium: Right atrial size was normal in size. Pericardium: Circumferential effusion, max diameter 1.9 cm. There is RA diastolic collapse, but no clear RV collapse. MV/TV inflow velocities do not suggest tamponade. Recommend clinical correlation. A moderately sized pericardial effusion is present. The pericardial effusion is circumferential. There is diastolic collapse of the right atrial wall. Mitral Valve: The mitral valve is normal in structure. Mild mitral valve regurgitation. No evidence of mitral valve stenosis. Tricuspid Valve: The tricuspid valve is normal in structure. Tricuspid valve regurgitation is trivial. No evidence of tricuspid stenosis. Aortic Valve: The aortic valve is tricuspid. Aortic valve regurgitation is not visualized. No aortic stenosis is present. Pulmonic Valve: The pulmonic valve was grossly normal. Pulmonic valve regurgitation is trivial. No evidence of pulmonic stenosis. Aorta: The aortic root, ascending aorta and aortic arch are all structurally normal, with no evidence of dilitation or obstruction. Venous: The inferior vena cava is normal in size with less than 50% respiratory variability, suggesting right atrial pressure of 8 mmHg. IAS/Shunts: The atrial septum is grossly normal. Additional Comments: There is a small pleural effusion in both left and right lateral regions.  LEFT VENTRICLE PLAX 2D LVIDd:         5.80 cm  Diastology LVIDs:         4.60 cm  LV e' lateral:   8.49 cm/s LV PW:         1.20 cm  LV E/e' lateral: 8.8 LV IVS:        1.10 cm  LV e' medial:    4.24 cm/s LVOT diam:     2.30 cm  LV E/e' medial:  17.7 LVOT Area:     4.15 cm  RIGHT VENTRICLE TAPSE (M-mode): 1.0 cm LEFT ATRIUM             Index       RIGHT ATRIUM          Index  LA diam:        4.50 cm 2.71 cm/m  RA Area:     7.32 cm LA Vol (A2C):   91.7 ml 55.32 ml/m RA Volume:   10.50 ml 6.33 ml/m LA Vol (A4C):  75.0 ml 45.24 ml/m LA Biplane Vol: 84.8 ml 51.16 ml/m   AORTA Ao Root diam: 2.80 cm MITRAL VALVE               TRICUSPID VALVE MV Area (PHT): 1.93 cm    TR Peak grad:   30.9 mmHg MV Decel Time: 393 msec    TR Vmax:        278.00 cm/s MV E velocity: 74.90 cm/s MV A velocity: 52.00 cm/s  SHUNTS MV E/A ratio:  1.44        Systemic Diam: 2.30 cm Jodelle Red MD Electronically signed by Jodelle Red MD Signature Date/Time: 03/29/2020/10:55:57 PM    Final    CT HEAD CODE STROKE WO CONTRAST  Result Date: 03/29/2020 CLINICAL DATA:  Code stroke.  Weakness, leaning to right EXAM: CT HEAD WITHOUT CONTRAST TECHNIQUE: Contiguous axial images were obtained from the base of the skull through the vertex without intravenous contrast. COMPARISON:  03/16/2020 FINDINGS: Brain: There is no acute intracranial hemorrhage, mass effect, or edema. No new loss of gray-white differentiation. 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 Additional patchy and confluent areas of hypoattenuation in the supratentorial white matter are nonspecific but probably reflects stable chronic microvascular ischemic changes. Prominence of the ventricles and sulci reflects stable parenchymal volume loss. Vascular: No hyperdense vessel. There is intracranial atherosclerotic calcification at the skull base. Skull: Unremarkable. Sinuses/Orbits: No acute abnormality Other: Mastoid air cells are clear. ASPECTS ([place] Stroke Program Early CT Score) - Ganglionic level infarction (caudate, lentiform nuclei, internal capsule, insula, M1-M3 cortex): 7 - Supraganglionic infarction (M4-M6 cortex): 3 Total score (0-10 with 10 being normal): 10 IMPRESSION: No acute intracranial hemorrhage or evidence of acute infarction. ASPECT score is 10. Stable chronic finding since recent  prior study. These results were communicated to Dr. Roda Shutters at 1:28 pmon 7/13/2021by text page via the [facility] messaging system. Electronically Signed   By: Guadlupe Spanish M.D.   On: 03/29/2020 13:31   VAS US CAROTID  Result Date: 03/30/2020 Carotid Arterial Duplex Study Indications:       CVA. Risk Factors:      Hyperlipidemia, Diabetes. Comparison Study:  no prior Performing Technologist: Blanch Media RVS  Examination Guidelines: A complete evaluation includes B-mode imaging, spectral Doppler, color Doppler, and power Doppler as needed of all accessible portions of each vessel. Bilateral testing is considered an integral part of a complete examination. Limited examinations for reoccurring indications may be performed as noted.  Right Carotid Findings: +----------+--------+--------+--------+------------------+--------+           PSV cm/sEDV cm/sStenosisPlaque DescriptionComments +----------+--------+--------+--------+------------------+--------+ CCA Prox  56      11              heterogenous               +----------+--------+--------+--------+------------------+--------+ CCA Distal42      14              heterogenous               +----------+--------+--------+--------+------------------+--------+ ICA Prox  142     32      1-39%   heterogenous               +----------+--------+--------+--------+------------------+--------+ ICA Distal59      18                                         +----------+--------+--------+--------+------------------+--------+  ECA       92                                                 +----------+--------+--------+--------+------------------+--------+ +----------+--------+-------+--------+-------------------+           PSV cm/sEDV cmsDescribeArm Pressure (mmHG) +----------+--------+-------+--------+-------------------+ [id number]                                         +----------+--------+-------+--------+-------------------+  +---------+--------+--+--------+--+---------+ VertebralPSV cm/s78EDV cm/s12Antegrade +---------+--------+--+--------+--+---------+  Left Carotid Findings: +----------+--------+--------+--------+------------------+--------+           PSV cm/sEDV cm/sStenosisPlaque DescriptionComments +----------+--------+--------+--------+------------------+--------+ CCA Prox  42      9               heterogenous               +----------+--------+--------+--------+------------------+--------+ CCA Distal43      12              heterogenous               +----------+--------+--------+--------+------------------+--------+ ICA Prox  106     22      1-39%   heterogenous               +----------+--------+--------+--------+------------------+--------+ ICA Distal78      23                                         +----------+--------+--------+--------+------------------+--------+ ECA       196                                                +----------+--------+--------+--------+------------------+--------+ +----------+--------+--------+--------+-------------------+           PSV cm/sEDV cm/sDescribeArm Pressure (mmHG) +----------+--------+--------+--------+-------------------+ [id number]                                          +----------+--------+--------+--------+-------------------+ +---------+--------+--+--------+-+---------+ VertebralPSV cm/s53EDV cm/s8Antegrade +---------+--------+--+--------+-+---------+   Summary: Right Carotid: Velocities in the right ICA are consistent with a 1-39% stenosis. Left Carotid: Velocities in the left ICA are consistent with a 1-39% stenosis. Vertebrals: Bilateral vertebral arteries demonstrate antegrade flow. *See table(s) above for measurements and observations.  Electronically signed by Delia Heady MD on 03/30/2020 at 8:06:57 AM.    Final     PHYSICAL EXAM  Temp:  [98.2 F (36.8 C)-98.6 F (37 C)] 98.6 F (37 C) (07/14  1202) Pulse Rate:  [79-100] 79 (07/14 1202) Resp:  [18-25] 23 (07/14 1202) BP: (98-125)/(43-88) 121/53 (07/14 1202) SpO2:  [92 %-100 %] 100 % (07/14 1202)  General - Well nourished, well developed, in no apparent distress.  Ophthalmologic - fundi not visualized due to noncooperation.  Cardiovascular - Regular rhythm and rate.  Neuro - awake, alert, orientated to name only, not orientated to place, age, time.  Moderate dysarthria, however no aphasia, able to repeat simple sentences, naming 2/4.  Not able to finish sentence within one breath due to SOB.  No  gaze palsy, tracking bilaterally, facial symmetric.  PERRL, blinking to visual threat bilaterally.  Moving bilateral upper extremity at least 4/5, bilateral lower extremity at least 3/5.  Sensation symmetrical.  Finger-to-nose grossly intact although slow.  Gait not tested.   ASSESSMENT/PLAN Ms. Darlys Buis is a 74 y.o. female with history of PAF on Eliquis, CHF, CKD, dementia admitted for bilateral lower extremity nonhealing wound. Baseline not ambulating with underlying dementia. In hospital had acute onset aphasia, with right facial droop and arm weakness.  Not a TPA candidate due to Eliquis administration prior to code stroke.  Not an IR candidate given premorbid MRS 4.    L brain TIA likely due to cerebral hypoperfusion  Code Stroke CT head No acute abnormality. ASPECTS 10.     MRI  No acute ischemia (limited exam)  MRA  Unable to tolerate  Carotid Doppler  B ICA 1-39% stenosis, VAs antegrade   2D Echo EF 30-35%. LA severely dilated. No source of embolus   LDL 26  HgbA1c 8.7  VTE prophylaxis - Eliquis  aspirin 81 mg daily and Eliquis (apixaban) daily prior to stroke and on PTA, now on aspirin 81 mg daily. Ok to resume Eliquis.   Therapy recommendations:  SNF  Disposition:  Ok to d/c to SNF  Atrial Fibrillation  On Eliquis (apixaban) daily in hospital at time of stroke, on PTA . Ok to continue Eliquis  (apixaban) daily given no stroke on MRI . eliquis resumed.    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 Cigarette smoker  Hx ETOH use  Hx Substance abuse  Coronary artery disease s/p MI  Congestive heart failure  Other Active Problems  Baseline dementia  Hospital day # 3  Neurology will sign off. Please call with questions. No neuro follow up needed at this time. Thanks for the consult.  Marvel Plan, MD PhD Stroke Neurology 03/30/2020 5:02 PM  To contact Stroke Continuity provider, please refer to [email or web address]. After hours, contact General Neurology

## 2020-03-30 NOTE — Care Management Important Message (Signed)
Important Message  Patient Details  Name: [name] MRN: [id number] Date of Birth: 1946/05/01   Medicare Important Message Given:  Yes     Renie Ora 03/30/2020, 1:29 PM

## 2020-03-30 NOTE — Progress Notes (Addendum)
  Progress Note    03/30/2020 7:42 AM 5 Days Post-Op  Subjective:  No complaints this morning; No R sided facial droop.  Moving all extremities well.   Vitals:   03/29/20 2327 03/30/20 0340  BP: (!) 112/54 (!) 107/53  Pulse: 95 86  Resp: 20 18  Temp: 98.3 F (36.8 C) 98.2 F (36.8 C)  SpO2: 98% 96%   Physical Exam: Lungs:  Non labored Incisions:  Groin cath site unremarkable Extremities:  Dressings left in place Neurologic: moving extremities well; baseline cognitive function  CBC    Component Value Date/Time   WBC 6.8 03/29/2020 1410   RBC 3.43 (L) 03/29/2020 1410   HGB 8.4 (L) 03/29/2020 1410   HCT 29.0 (L) 03/29/2020 1410   PLT 230 03/29/2020 1410   MCV 84.5 03/29/2020 1410   MCH 24.5 (L) 03/29/2020 1410   MCHC 29.0 (L) 03/29/2020 1410   RDW 17.9 (H) 03/29/2020 1410   LYMPHSABS 2.2 03/16/2020 0240   MONOABS 0.7 03/16/2020 0240   EOSABS 0.3 03/16/2020 0240   BASOSABS 0.0 03/16/2020 0240    BMET    Component Value Date/Time   NA 138 03/29/2020 1410   K 4.3 03/29/2020 1410   CL 107 03/29/2020 1410   CO2 24 03/29/2020 1410   GLUCOSE 186 (H) 03/29/2020 1410   BUN 22 03/29/2020 1410   CREATININE 1.47 (H) 03/29/2020 1410   CALCIUM 8.1 (L) 03/29/2020 1410   GFRNONAA 35 (L) 03/29/2020 1410   GFRAA 41 (L) 03/29/2020 1410    INR    Component Value Date/Time   INR 1.2 03/29/2020 1410     Intake/Output Summary (Last 24 hours) at 03/30/2020 0742 Last data filed at 03/30/2020 0342 Gross per 24 hour  Intake --  Output 775 ml  Net -775 ml     Assessment/Plan:  74 y.o. female is s/p Aortogram  5 Days Post-Op   Discharge yesterday delayed due to neurologic event MR negative for CVA; clinically patient seems to be back to baseline We will resume Eliquis and d/c to SNF when cleared by Neurology   Emilie Rutter, PA-C [facility] [phone] 03/30/2020 7:42 AM  Events of yesterday noted.  Seems to have had a TIA essentially  negative workup Hopefully back to SNF today Will update daughter [name] at pt request  Fabienne Bruns, MD [facility] [place] Office: [phone]  Addendum: spoke with pt daughter [name] she still has some misgivings about going back to [facility].  She will contact social worker today  Fabienne Bruns, MD [facility] [place] Office: [phone]

## 2020-03-30 NOTE — Progress Notes (Signed)
Inpatient Diabetes Program Recommendations  AACE/ADA: New Consensus Statement on Inpatient Glycemic Control (2015)  Target Ranges:  Prepandial:   less than 140 mg/dL      Peak postprandial:   less than 180 mg/dL (1-2 hours)      Critically ill patients:  140 - 180 mg/dL   Lab Results  Component Value Date   GLUCAP 231 (H) 03/30/2020   HGBA1C 8.7 (H) 03/30/2020    Review of Glycemic Control Results for [name], [name] (MRN [id number]) as of 03/30/2020 13:14  Ref. Range 03/29/2020 13:07 03/29/2020 19:01 03/29/2020 22:32 03/30/2020 06:07 03/30/2020 11:26  Glucose-Capillary Latest Ref Range: 70 - 99 mg/dL 750 (H) 518 (H) 335 (H) 189 (H) 231 (H)   Diabetes history: DM 2 Outpatient Diabetes medications:  Levemir 14 units daily, Humalog 0-10 units tid with meals  Current orders for Inpatient glycemic control:  Novolog 0-15 units tid with meals Levemir 14 units daily Inpatient Diabetes Program Recommendations:   If appropriate, consider adding Novolog 3 units tid with meals (hold if patient eats less than 50%).   Thanks,  Beryl Meager, RN, BC-ADM Inpatient Diabetes Coordinator Pager [phone] (8a-5p)

## 2020-03-30 NOTE — [place] FL2 (Signed)
[place] MEDICAID FL2 LEVEL OF CARE SCREENING TOOL     IDENTIFICATION  Patient Name: [name] Birthdate: 1946-08-14 Sex: female Admission Date (Current Location): 03/25/2020  [facility] and [place] Number:  Producer, television/film/video and Address:  [facility] [place], [address], [place], [place] [address]      Provider Number: [id number]  Attending Physician Name and Address:  Chuck Hint, *  Relative Name and Phone Number:       Current Level of Care: Hospital Recommended Level of Care: Skilled Nursing Facility Prior Approval Number:    Date Approved/Denied:   PASRR Number: [id number]  Discharge Plan: SNF    Current Diagnoses: Patient Active Problem List   Diagnosis Date Noted  . Cerebral thrombosis with cerebral infarction 03/29/2020  . Pressure injury of skin 03/26/2020  . 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 PAD (peripheral artery disease) (HCC) 03/25/2020  . Peripheral arterial disease (HCC) 03/25/2020    Orientation RESPIRATION BLADDER Height & Weight     Self, Situation  O2 External catheter, Continent Weight: 143 lb (64.9 kg) Height:  5\' 2"  (157.5 cm)  BEHAVIORAL SYMPTOMS/MOOD NEUROLOGICAL BOWEL NUTRITION STATUS      Continent Diet (please see discharge summary)  AMBULATORY STATUS COMMUNICATION OF NEEDS Skin     Verbally Surgical wounds (sacrum stage I, foam lift dressing)                       Personal Care Assistance Level of Assistance  Bathing, Dressing, Feeding Bathing Assistance: Limited assistance Feeding assistance: Independent Dressing Assistance: Limited assistance     Functional Limitations Info  Sight, Hearing, Speech Sight Info: Adequate Hearing Info: Adequate Speech Info: Adequate    SPECIAL CARE FACTORS FREQUENCY  PT (By licensed PT), OT (By licensed OT)     PT Frequency: 3x per wek OT Frequency: 3x per week            Contractures Contractures  Info: Not present    Additional Factors Info  Code Status, Allergies Code Status Info: FULL Allergies Info: Codeine,Erythromycin,Sulfa Antibiotics           Current Medications (03/30/2020):  This is the current hospital active medication list Current Facility-Administered Medications  Medication Dose Route Frequency Provider Last Rate Last Admin  . 0.9 %  sodium chloride infusion   Intravenous Continuous 04/01/2020, MD      . acetaminophen (TYLENOL) tablet 650 mg  650 mg Oral Q6H PRN Baglia, Corrina, PA-C      . amoxicillin-clavulanate (AUGMENTIN) 500-125 MG per tablet 500 mg  1 tablet Oral Q12H Marvel Plan, MD   500 mg at 03/30/20 0829  . aspirin EC tablet 81 mg  81 mg Oral Daily Baglia, Corrina, PA-C   81 mg at 03/30/20 0824  . atorvastatin (LIPITOR) tablet 20 mg  20 mg Oral Daily Baglia, Corrina, PA-C   20 mg at 03/30/20 0824  . Chlorhexidine Gluconate Cloth 2 % PADS 6 each  6 each Topical Daily 04/01/20, MD   6 each at 03/29/20 0745  . famotidine (PEPCID) tablet 20 mg  20 mg Oral Q breakfast Baglia, Corrina, PA-C   20 mg at 03/30/20 0824  . gabapentin (NEURONTIN) capsule 200 mg  200 mg Oral TID Baglia, Corrina, PA-C   200 mg at 03/30/20 0824  . hydrALAZINE (APRESOLINE) injection 5 mg  5 mg Intravenous Q20 Min PRN Baglia, Corrina, PA-C      .  insulin aspart (novoLOG) injection 0-15 Units  0-15 Units Subcutaneous TID WC Baglia, Corrina, PA-C   3 Units at 03/30/20 0651  . insulin detemir (LEVEMIR) injection 14 Units  14 Units Subcutaneous Daily Baglia, Corrina, PA-C   14 Units at 03/30/20 0830  . labetalol (NORMODYNE) injection 10 mg  10 mg Intravenous Q10 min PRN Baglia, Corrina, PA-C      . LORazepam (ATIVAN) tablet 0.5 mg  0.5 mg Oral Q6H PRN Baglia, Corrina, PA-C      . metoprolol tartrate (LOPRESSOR) tablet 12.5 mg  12.5 mg Oral Daily Baglia, Corrina, PA-C   12.5 mg at 03/30/20 0824  . multivitamin with minerals tablet 1 tablet  1 tablet Oral Daily Dara Lords, PA-C   1 tablet at 03/30/20 0824  . nitroGLYCERIN (NITROSTAT) SL tablet 0.4 mg  0.4 mg Sublingual Q5 min PRN Sherren Kerns, MD      . ondansetron [facility]) injection 4 mg  4 mg Intravenous Q6H PRN Baglia, Corrina, PA-C      . QUEtiapine (SEROQUEL) tablet 50 mg  50 mg Oral QHS Baglia, Corrina, PA-C   50 mg at 03/29/20 2027  . sodium chloride flush (NS) 0.9 % injection 3 mL  3 mL Intravenous Q12H Baglia, Corrina, PA-C   3 mL at 03/30/20 0833  . sodium chloride flush (NS) 0.9 % injection 3 mL  3 mL Intravenous PRN Baglia, Corrina, PA-C      . sodium chloride flush (NS) 0.9 % injection 3 mL  3 mL Intravenous Q12H Baglia, Corrina, PA-C   3 mL at 03/29/20 2037  . sodium chloride flush (NS) 0.9 % injection 3 mL  3 mL Intravenous PRN Baglia, Corrina, PA-C      . tamsulosin (FLOMAX) capsule 0.4 mg  0.4 mg Oral QHS Baglia, Corrina, PA-C   0.4 mg at 03/29/20 2027     Discharge Medications: Please see discharge summary for a list of discharge medications.  Relevant Imaging Results:  Relevant Lab Results:   Additional Information SSN [id number]  Eduard Roux, [place]

## 2020-03-31 LAB — GLUCOSE, CAPILLARY
Glucose-Capillary: 163 mg/dL — ABNORMAL HIGH (ref 70–99)
Glucose-Capillary: 143 mg/dL — ABNORMAL HIGH (ref 70–99)
Glucose-Capillary: 225 mg/dL — ABNORMAL HIGH (ref 70–99)
Glucose-Capillary: 151 mg/dL — ABNORMAL HIGH (ref 70–99)

## 2020-03-31 NOTE — Progress Notes (Addendum)
  Progress Note    03/31/2020 7:52 AM 6 Days Post-Op  Subjective:  No complaints this morning   Vitals:   03/31/20 0329 03/31/20 0747  BP: (!) 113/54 (!) 114/51  Pulse: 77 81  Resp: 20 17  Temp: 98.2 F (36.8 C) 97.6 F (36.4 C)  SpO2: 98%    Physical Exam: Lungs:  Non labored Incisions:  L groin without hematoma Extremities:  Feet warm to touch with good cap refill; dressings left in place Neurologic: baseline  CBC    Component Value Date/Time   WBC 6.8 03/29/2020 1410   RBC 3.43 (L) 03/29/2020 1410   HGB 8.4 (L) 03/29/2020 1410   HCT 29.0 (L) 03/29/2020 1410   PLT 230 03/29/2020 1410   MCV 84.5 03/29/2020 1410   MCH 24.5 (L) 03/29/2020 1410   MCHC 29.0 (L) 03/29/2020 1410   RDW 17.9 (H) 03/29/2020 1410   LYMPHSABS 2.2 03/16/2020 0240   MONOABS 0.7 03/16/2020 0240   EOSABS 0.3 03/16/2020 0240   BASOSABS 0.0 03/16/2020 0240    BMET    Component Value Date/Time   NA 138 03/29/2020 1410   K 4.3 03/29/2020 1410   CL 107 03/29/2020 1410   CO2 24 03/29/2020 1410   GLUCOSE 186 (H) 03/29/2020 1410   BUN 22 03/29/2020 1410   CREATININE 1.47 (H) 03/29/2020 1410   CALCIUM 8.1 (L) 03/29/2020 1410   GFRNONAA 35 (L) 03/29/2020 1410   GFRAA 41 (L) 03/29/2020 1410    INR    Component Value Date/Time   INR 1.2 03/29/2020 1410     Intake/Output Summary (Last 24 hours) at 03/31/2020 0752 Last data filed at 03/31/2020 8938 Gross per 24 hour  Intake --  Output 600 ml  Net -600 ml     Assessment/Plan:  74 y.o. female is s/p aortogram 6 Days Post-Op   Neurology has signed off after negative workup including MR brain, echo, and carotid duplex TOC team again working on SNF placement D/c when approved   Emilie Rutter, PA-C [facility] [phone] 03/31/2020 7:52 AM  Awaiting SNF No change  Fabienne Bruns, MD [facility] [place] Office: [phone]

## 2020-03-31 NOTE — TOC Progression Note (Addendum)
Transition of Care [facility]) - Progression Note    Patient Details  Name: [name] MRN: [id number] Date of Birth: May 13, 1946  Transition of Care [facility]) CM/SW Contact  [name], [place] Phone Number: 03/31/2020, 12:37 PM  Clinical Narrative:   LCSW spoke with pt daughter [name]. After further review patient and family have decided to go back to [facility]. 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   2:59 PM Spoke with Melissa in admissions. Patient can return to facility tomorrow.   [name] [place] CSW [phone]     Expected Discharge Plan: Skilled Nursing Facility Barriers to Discharge: Barriers Resolved  Expected Discharge Plan and Services Expected Discharge Plan: Skilled Nursing Facility In-house Referral: Clinical Social Work     Living arrangements for the past 2 months: Skilled Nursing Facility Expected Discharge Date: 03/29/20                                     Social Determinants of Health (SDOH) Interventions    Readmission Risk Interventions No flowsheet data found.

## 2020-04-01 LAB — GLUCOSE, CAPILLARY
Glucose-Capillary: 101 mg/dL — ABNORMAL HIGH (ref 70–99)
Glucose-Capillary: 221 mg/dL — ABNORMAL HIGH (ref 70–99)
Glucose-Capillary: 133 mg/dL — ABNORMAL HIGH (ref 70–99)

## 2020-04-01 LAB — SARS CORONAVIRUS 2 (TAT 6-24 HRS): SARS Coronavirus 2: NEGATIVE

## 2020-04-01 NOTE — Progress Notes (Addendum)
  Progress Note    04/01/2020 1:15 PM 7 Days Post-Op  Subjective:  No complaints    Vitals:   04/01/20 1000 04/01/20 1113  BP: (!) 113/43 (!) 116/47  Pulse:  83  Resp:  18  Temp:  98.1 F (36.7 C)  SpO2:  97%    Physical Exam: General:  No distress; resting comfortably Lungs:  Non labored Extremities:  Bilateral great toes with good capillary refill   CBC    Component Value Date/Time   WBC 6.8 03/29/2020 1410   RBC 3.43 (L) 03/29/2020 1410   HGB 8.4 (L) 03/29/2020 1410   HCT 29.0 (L) 03/29/2020 1410   PLT 230 03/29/2020 1410   MCV 84.5 03/29/2020 1410   MCH 24.5 (L) 03/29/2020 1410   MCHC 29.0 (L) 03/29/2020 1410   RDW 17.9 (H) 03/29/2020 1410   LYMPHSABS 2.2 03/16/2020 0240   MONOABS 0.7 03/16/2020 0240   EOSABS 0.3 03/16/2020 0240   BASOSABS 0.0 03/16/2020 0240    BMET    Component Value Date/Time   NA 138 03/29/2020 1410   K 4.3 03/29/2020 1410   CL 107 03/29/2020 1410   CO2 24 03/29/2020 1410   GLUCOSE 186 (H) 03/29/2020 1410   BUN 22 03/29/2020 1410   CREATININE 1.47 (H) 03/29/2020 1410   CALCIUM 8.1 (L) 03/29/2020 1410   GFRNONAA 35 (L) 03/29/2020 1410   GFRAA 41 (L) 03/29/2020 1410    INR    Component Value Date/Time   INR 1.2 03/29/2020 1410     Intake/Output Summary (Last 24 hours) at 04/01/2020 1315 Last data filed at 04/01/2020 0421 Gross per 24 hour  Intake 100 ml  Output 1625 ml  Net -1525 ml     Assessment:  74 y.o. female is s/p:  aortogram  7 Days Post-Op  Plan: -pt with no facial droop and bilateral hand grips are equal.  Neuro work up was negative.  -for dc to SNF today    Doreatha Massed, PA-C [facility] [phone] 04/01/2020 1:15 PM  Agree with above. D/c to SNF  Fabienne Bruns, MD [facility] [place] Office: [phone]

## 2020-04-01 NOTE — Progress Notes (Signed)
Called [facility] to give report. I was transferred to another line which rang multiple times without anyone answering. PTAR to be called by CSW. I will attempt to call again to give report. Pt resting with call bell within reach.  Will continue to monitor.

## 2020-04-01 NOTE — TOC Transition Note (Addendum)
Transition of Care [facility]) - CM/SW Discharge Note   Patient Details  Name: [name] MRN: [id number] Date of Birth: 10/27/1945  Transition of Care [facility]) CM/SW Contact:  Luiz Blare Phone Number: 04/01/2020, 11:45 AM   Clinical Narrative:    Patient will DC to: [facility] SNF Family notified: daughter, [name] Transport by: Sharin Mons  RN, patient, patient's family, and facility notified of DC. Discharge Summary and FL2 sent to facility. RN to call report prior to discharge [phone] Room 413A). DC packet on chart.   CSW will sign off for now as social work intervention is no longer needed. Please consult [place] again if new needs arise.    Final next level of care: Skilled Nursing Facility Barriers to Discharge: No Barriers Identified   Patient Goals and CMS Choice   CMS Medicare.gov Compare Post Acute Care list provided to:: Patient Represenative (must comment) [name]) Choice offered to / list presented to : Adult Children  Discharge Placement              Patient chooses bed at: [facility] Patient to be transferred to facility by: PTAR Name of family member notified: [name] Patient and family notified of of transfer: 04/01/20  Discharge Plan and Services In-house Referral: Clinical Social Work                                   Social Determinants of Health (SDOH) Interventions     Readmission Risk Interventions No flowsheet data found.

## 2020-04-08 ENCOUNTER — Encounter (HOSPITAL_BASED_OUTPATIENT_CLINIC_OR_DEPARTMENT_OTHER): Payer: Medicare Other | Attending: Internal Medicine | Admitting: Internal Medicine

## 2020-04-08 DIAGNOSIS — E1122 Type 2 diabetes mellitus with diabetic chronic kidney disease: Secondary | ICD-10-CM | POA: Insufficient documentation

## 2020-04-08 DIAGNOSIS — E11621 Type 2 diabetes mellitus with foot ulcer: Secondary | ICD-10-CM | POA: Diagnosis not present

## 2020-04-08 DIAGNOSIS — Z7901 Long term (current) use of anticoagulants: Secondary | ICD-10-CM | POA: Insufficient documentation

## 2020-04-08 DIAGNOSIS — I130 Hypertensive heart and chronic kidney disease with heart failure and stage 1 through stage 4 chronic kidney disease, or unspecified chronic kidney disease: Secondary | ICD-10-CM | POA: Insufficient documentation

## 2020-04-08 DIAGNOSIS — Z87891 Personal history of nicotine dependence: Secondary | ICD-10-CM | POA: Diagnosis not present

## 2020-04-08 DIAGNOSIS — E1151 Type 2 diabetes mellitus with diabetic peripheral angiopathy without gangrene: Secondary | ICD-10-CM | POA: Diagnosis not present

## 2020-04-08 DIAGNOSIS — Z881 Allergy status to other antibiotic agents status: Secondary | ICD-10-CM | POA: Diagnosis not present

## 2020-04-08 DIAGNOSIS — L97518 Non-pressure chronic ulcer of other part of right foot with other specified severity: Secondary | ICD-10-CM | POA: Insufficient documentation

## 2020-04-08 DIAGNOSIS — Z882 Allergy status to sulfonamides status: Secondary | ICD-10-CM | POA: Diagnosis not present

## 2020-04-08 DIAGNOSIS — Z933 Colostomy status: Secondary | ICD-10-CM | POA: Diagnosis not present

## 2020-04-08 DIAGNOSIS — I2510 Atherosclerotic heart disease of native coronary artery without angina pectoris: Secondary | ICD-10-CM | POA: Diagnosis not present

## 2020-04-08 DIAGNOSIS — E1142 Type 2 diabetes mellitus with diabetic polyneuropathy: Secondary | ICD-10-CM | POA: Diagnosis not present

## 2020-04-08 DIAGNOSIS — Z8673 Personal history of transient ischemic attack (TIA), and cerebral infarction without residual deficits: Secondary | ICD-10-CM | POA: Insufficient documentation

## 2020-04-08 DIAGNOSIS — J449 Chronic obstructive pulmonary disease, unspecified: Secondary | ICD-10-CM | POA: Insufficient documentation

## 2020-04-08 DIAGNOSIS — F0150 Vascular dementia without behavioral disturbance: Secondary | ICD-10-CM | POA: Diagnosis not present

## 2020-04-08 DIAGNOSIS — I480 Paroxysmal atrial fibrillation: Secondary | ICD-10-CM | POA: Insufficient documentation

## 2020-04-08 DIAGNOSIS — N183 Chronic kidney disease, stage 3 unspecified: Secondary | ICD-10-CM | POA: Insufficient documentation

## 2020-04-08 DIAGNOSIS — I509 Heart failure, unspecified: Secondary | ICD-10-CM | POA: Insufficient documentation

## 2020-04-08 DIAGNOSIS — Z885 Allergy status to narcotic agent status: Secondary | ICD-10-CM | POA: Diagnosis not present

## 2020-04-08 NOTE — Progress Notes (Signed)
MARLANE, HIRSCHMANN ([id number]) Visit Report for 04/08/2020 Abuse/Suicide Risk Screen Details Patient Name: Date of Service: [name], [name] 04/08/2020 10:30 A M Medical Record Number: [id number] Patient Account Number: [id number] Date of Birth/Sex: Treating RN: 23-Nov-1945 (74 y.o. Freddy Finner Primary Care Karandeep Resende: Grafton Folk CO BS Other Clinician: Referring Jaeven Wanzer: Treating Andreka Stucki/Extender: Sharlette Dense, JA CO BS Weeks in Treatment: 0 Abuse/Suicide Risk Screen Items Answer ABUSE RISK SCREEN: Has anyone close to you tried to hurt or harm you recentlyo No Do you feel uncomfortable with anyone in your familyo No Has anyone forced you do things that you didnt want to doo No Electronic Signature(s) Signed: 04/08/2020 5:42:58 PM By: Yevonne Pax RN Entered By: Yevonne Pax on 04/08/2020 11:24:08 -------------------------------------------------------------------------------- Activities of Daily Living Details Patient Name: Date of Service: [name], [name] 04/08/2020 10:30 A M Medical Record Number: [id number] Patient Account Number: [id number] Date of Birth/Sex: Treating RN: 1945-11-06 (74 y.o. Freddy Finner Primary Care Philisha Weinel: Grafton Folk CO BS Other Clinician: Referring Latysha Thackston: Treating Allia Wiltsey/Extender: Sharlette Dense, JA CO BS Weeks in Treatment: 0 Activities of Daily Living Items Answer Activities of Daily Living (Please select one for each item) Drive Automobile Not Able T Medications ake Not Able Use T elephone Need Assistance Care for Appearance Not Able Use T oilet Not Able Bath / Shower Not Able Dress Self Not Able Feed Self Need Assistance Walk Not Able Get In / Out Bed Not Able Housework Not Able Prepare Meals Need Assistance Handle Money Need Assistance Shop for Self Not Able Electronic Signature(s) Signed: 04/08/2020 5:42:58 PM By: Yevonne Pax RN Entered By: Yevonne Pax on 04/08/2020  11:25:20 -------------------------------------------------------------------------------- Education Screening Details Patient Name: Date of Service: [name], [name] 04/08/2020 10:30 A M Medical Record Number: [id number] Patient Account Number: [id number] Date of Birth/Sex: Treating RN: Feb 08, 1946 (74 y.o. Freddy Finner Primary Care Draxton Luu: Grafton Folk CO BS Other Clinician: Referring Daiton Cowles: Treating Courtany Mcmurphy/Extender: Ulyses Amor CO BS Weeks in Treatment: 0 Primary Learner Assessed: Patient Learning Preferences/Education Level/Primary Language Learning Preference: Explanation Highest Education Level: High School Preferred Language: English Cognitive Barrier Language Barrier: No Translator Needed: No Memory Deficit: No Emotional Barrier: No Cultural/Religious Beliefs Affecting Medical Care: No Physical Barrier Impaired Vision: No Impaired Hearing: No Decreased Hand dexterity: No Knowledge/Comprehension Knowledge Level: Low Comprehension Level: Low Ability to understand written instructions: Low Ability to understand verbal instructions: Low Motivation Anxiety Level: Anxious Cooperation: Cooperative Education Importance: Acknowledges Need Interest in Health Problems: Asks Questions Perception: Coherent Willingness to Engage in Self-Management High Activities: Readiness to Engage in Self-Management High Activities: Electronic Signature(s) Signed: 04/08/2020 5:42:58 PM By: Yevonne Pax RN Entered By: Yevonne Pax on 04/08/2020 11:26:19 -------------------------------------------------------------------------------- Fall Risk Assessment Details Patient Name: Date of Service: [name], [name] 04/08/2020 10:30 A M Medical Record Number: [id number] Patient Account Number: [id number] Date of Birth/Sex: Treating RN: Feb 09, 1946 (74 y.o. Freddy Finner Primary Care Chue Berkovich: Grafton Folk CO BS Other Clinician: Referring Leeana Creer: Treating  Ashlay Altieri/Extender: Sharlette Dense, JA CO BS Weeks in Treatment: 0 Fall Risk Assessment Items Have you had 2 or more falls in the last 12 monthso 0 No Have you had any fall that resulted in injury in the last 12 monthso 0 No FALLS RISK SCREEN History of falling - immediate or within 3 months 0 No Secondary diagnosis (Do you have 2 or more medical diagnoseso) 0 No Ambulatory aid None/bed rest/wheelchair/nurse 0 No Crutches/cane/walker 0 No Furniture 0 No Intravenous therapy Access/Saline/Heparin [employer]  0 No Gait/Transferring Normal/ bed rest/ wheelchair 0 No Weak (short steps with or without shuffle, stooped but able to lift head while walking, may seek 0 No support from furniture) Impaired (short steps with shuffle, may have difficulty arising from chair, head down, impaired 0 No balance) Mental Status Oriented to own ability 0 No Electronic Signature(s) Signed: 04/08/2020 5:42:58 PM By: Yevonne Pax RN Entered By: Yevonne Pax on 04/08/2020 11:26:45 -------------------------------------------------------------------------------- Foot Assessment Details Patient Name: Date of Service: [name], [name] 04/08/2020 10:30 A M Medical Record Number: [id number] Patient Account Number: [id number] Date of Birth/Sex: Treating RN: August 11, 1946 (74 y.o. F) Epps, Carrie Primary Care Narada Uzzle: Grafton Folk CO BS Other Clinician: Referring Minervia Osso: Treating Krisha Beegle/Extender: Sharlette Dense, JA CO BS Weeks in Treatment: 0 Foot Assessment Items [x]  Unable to perform due to altered mental status Site Locations + = Sensation present, - = Sensation absent, C = Callus, U = Ulcer R = Redness, W = Warmth, M = Maceration, PU = Pre-ulcerative lesion F = Fissure, S = Swelling, D = Dryness Assessment Right: Left: Other Deformity: No No Prior Foot Ulcer: No No Prior Amputation: No No Charcot Joint: No No Ambulatory Status: Gait: Electronic Signature(s) Signed: 04/08/2020 5:42:58 PM  By: 04/10/2020 RN Entered By: Yevonne Pax on 04/08/2020 11:27:16 -------------------------------------------------------------------------------- Nutrition Risk Screening Details Patient Name: Date of Service: [name], [name] 04/08/2020 10:30 A M Medical Record Number: 04/10/2020 Patient Account Number: [id number] Date of Birth/Sex: Treating RN: 1946/04/15 (74 y.o. F) Epps, Carrie Primary Care Shanon Seawright: 62 CO BS Other Clinician: Referring Alie Moudy: Treating Kristoff Coonradt/Extender: Grafton Folk, JA CO BS Weeks in Treatment: 0 Height (in): 62 Weight (lbs): 158 Body Mass Index (BMI): 28.9 Nutrition Risk Screening Items Score Screening NUTRITION RISK SCREEN: I have an illness or condition that made me change the kind and/or amount of food I eat 0 No I eat fewer than two meals per day 0 No I eat few fruits and vegetables, or milk products 0 No I have three or more drinks of beer, liquor or wine almost every day 0 No I have tooth or mouth problems that make it hard for me to eat 0 No I don't always have enough money to buy the food I need 0 No I eat alone most of the time 0 No I take three or more different prescribed or over-the-counter drugs a day 1 Yes Without wanting to, I have lost or gained 10 pounds in the last six months 2 Yes I am not always physically able to shop, cook and/or feed myself 2 Yes Nutrition Protocols Good Risk Protocol Moderate Risk Protocol 0 Provide education on nutrition High Risk Proctocol Risk Level: Moderate Risk Score: 5 Electronic Signature(s) Signed: 04/08/2020 5:42:58 PM By: 04/10/2020 RN Entered By: Yevonne Pax on 04/08/2020 11:27:06

## 2020-04-08 NOTE — Progress Notes (Signed)
[name], [name] ([id number]) Visit Report for 04/08/2020 Allergy List Details Patient Name: Date of Service: [name], [name] 04/08/2020 10:30 A M Medical Record Number: [id number] Patient Account Number: [id number] Date of Birth/Sex: Treating RN: [birth year] (74 y.o. Freddy Finner Primary Care Emidio Warrell: Grafton Folk CO BS Other Clinician: Referring Noha Milberger: Treating Staceyann Knouff/Extender: Sharlette Dense, JA CO BS Weeks in Treatment: 0 Allergies Active Allergies codeine erythromycin base Sulfa (Sulfonamide Antibiotics) Allergy Notes Electronic Signature(s) Signed: 04/08/2020 5:42:58 PM By: Yevonne Pax RN Entered By: Yevonne Pax on 04/08/2020 11:20:56 -------------------------------------------------------------------------------- Arrival Information Details Patient Name: Date of Service: [name], [name] 04/08/2020 10:30 A M Medical Record Number: [id number] Patient Account Number: [id number] Date of Birth/Sex: Treating RN: 1945-12-08 (74 y.o. [name] Primary Care Riana Tessmer: Grafton Folk CO BS Other Clinician: Referring Miyanna Wiersma: Treating Randa Riss/Extender: Sharlette Dense, JA CO BS Weeks in Treatment: 0 Visit Information Patient Arrived: Wheel Chair Arrival Time: 11:00 Accompanied By: caregiver Transfer Assistance: None Patient Identification Verified: Yes Secondary Verification Process Completed: Yes Electronic Signature(s) Signed: 04/08/2020 5:42:58 PM By: Yevonne Pax RN Entered By: Yevonne Pax on 04/08/2020 11:19:35 -------------------------------------------------------------------------------- Clinic Level of Care Assessment Details Patient Name: Date of Service: [name], [name] 04/08/2020 10:30 A M Medical Record Number: [id number] Patient Account Number: [id number] Date of Birth/Sex: Treating RN: 1945-09-29 (74 y.o. F) Cherylin Mylar Primary Care Benard Minturn: Grafton Folk CO BS Other Clinician: Referring Adger Cantera: Treating  Yonna Alwin/Extender: Sharlette Dense, JA CO BS Weeks in Treatment: 0 Clinic Level of Care Assessment Items TOOL 1 Quantity Score X- 1 0 Use when EandM and Procedure is performed on INITIAL visit ASSESSMENTS - Nursing Assessment / Reassessment X- 1 20 General Physical Exam (combine w/ comprehensive assessment (listed just below) when performed on new pt. evals) X- 1 25 Comprehensive Assessment (HX, ROS, Risk Assessments, Wounds Hx, etc.) ASSESSMENTS - Wound and Skin Assessment / Reassessment []  - 0 Dermatologic / Skin Assessment (not related to wound area) ASSESSMENTS - Ostomy and/or Continence Assessment and Care []  - 0 Incontinence Assessment and Management []  - 0 Ostomy Care Assessment and Management (repouching, etc.) PROCESS - Coordination of Care X - Simple Patient / Family Education for ongoing care 1 15 []  - 0 Complex (extensive) Patient / Family Education for ongoing care X- 1 10 Staff obtains , Records, T Results / Process Orders est X- 1 10 Staff telephones HHA, Nursing Homes / Clarify orders / etc []  - 0 Routine Transfer to another Facility (non-emergent condition) []  - 0 Routine Hospital Admission (non-emergent condition) X- 1 15 New Admissions / / Ordering NPWT Apligraf, etc. , []  - 0 Emergency Hospital Admission (emergent condition) PROCESS - Special Needs []  - 0 Pediatric / Minor Patient Management []  - 0 Isolation Patient Management []  - 0 Hearing / Language / Visual special needs []  - 0 Assessment of Community assistance (transportation, D/C planning, etc.) []  - 0 Additional assistance / Altered mentation []  - 0 Support Surface(s) Assessment (bed, cushion, seat, etc.) INTERVENTIONS - Miscellaneous []  - 0 External ear exam []  - 0 Patient Transfer (multiple staff / / Similar devices) []  - 0 Simple Staple / Suture removal (25 or less) []  - 0 Complex Staple / Suture removal (26 or more) []  -  0 Hypo/Hyperglycemic Management (do not check if billed separately) X- 1 15 Ankle / Brachial Index (ABI) - do not check if billed separately Has the patient been seen at the hospital within the last three years: Yes Total Score:  110 Level Of Care: New/Established - Level 3 Electronic Signature(s) Signed: 04/08/2020 5:51:32 PM By: Cherylin Mylar Entered By: Cherylin Mylar on 04/08/2020 13:05:31 -------------------------------------------------------------------------------- Encounter Discharge Information Details Patient Name: Date of Service: [name], [name] 04/08/2020 10:30 A M Medical Record Number: [id number] Patient Account Number: [id number] Date of Birth/Sex: Treating RN: 07/12/1946 (74 y.o. [name] Primary Care Selin Eisler: Grafton Folk CO BS Other Clinician: Referring Charis Juliana: Treating Alfreida Steffenhagen/Extender: Sharlette Dense, JA CO BS Weeks in Treatment: 0 Encounter Discharge Information Items Discharge Condition: Stable Ambulatory Status: Wheelchair Discharge Destination: Skilled Nursing Facility Telephoned: No Orders Sent: Yes Transportation: Other Accompanied By: caregiver Schedule Follow-up Appointment: Yes Clinical Summary of Care: Patient Declined Electronic Signature(s) Signed: 04/08/2020 5:51:32 PM By: Cherylin Mylar Entered By: Cherylin Mylar on 04/08/2020 12:40:58 -------------------------------------------------------------------------------- Lower Extremity Assessment Details Patient Name: Date of Service: [name], [name] 04/08/2020 10:30 A M Medical Record Number: [id number] Patient Account Number: [id number] Date of Birth/Sex: Treating RN: 12-Jul-1946 (74 y.o. Freddy Finner Primary Care Ailanie Ruttan: Grafton Folk CO BS Other Clinician: Referring Lalah Durango: Treating Jaecob Lowden/Extender: Sharlette Dense, JA CO BS Weeks in Treatment: 0 Edema Assessment Assessed: [Left: No] [Right: No] [Left: Edema] [Right: :] Calf Left:  Right: Point of Measurement: 38 cm From Medial Instep 27 cm 29 cm Ankle Left: Right: Point of Measurement: 8 cm From Medial Instep 19 cm 18 cm Vascular Assessment Blood Pressure: Brachial: [Right:99] Ankle: [Right:Dorsalis Pedis: 40 0.40] Notes unable to obtain in left Electronic Signature(s) Signed: 04/08/2020 5:42:58 PM By: Yevonne Pax RN Signed: 04/08/2020 5:42:58 PM By: Yevonne Pax RN Entered By: Yevonne Pax on 04/08/2020 11:40:32 -------------------------------------------------------------------------------- Multi-Disciplinary Care Plan Details Patient Name: Date of Service: [name], [name] 04/08/2020 10:30 A M Medical Record Number: [id number] Patient Account Number: [id number] Date of Birth/Sex: Treating RN: [birth year] (74 y.o. F) Cherylin Mylar Primary Care Kelina Beauchamp: Grafton Folk CO BS Other Clinician: Referring Gilverto Dileonardo: Treating Kenidee Cregan/Extender: Sharlette Dense, JA CO BS Weeks in Treatment: 0 Active Inactive Nutrition Nursing Diagnoses: Impaired glucose control: actual or potential Goals: Patient/caregiver verbalizes understanding of need to maintain therapeutic glucose control per primary care physician Date Initiated: 04/08/2020 Target Resolution Date: 05/06/2020 Goal Status: Active Interventions: Provide education on elevated blood sugars and impact on wound healing Notes: Pain, Acute or Chronic Nursing Diagnoses: Pain, acute or chronic: actual or potential Goals: Patient/caregiver will verbalize adequate pain control between visits Date Initiated: 04/08/2020 Target Resolution Date: 05/06/2020 Goal Status: Active Interventions: Provide education on pain management Notes: Wound/Skin Impairment Nursing Diagnoses: Impaired tissue integrity Goals: Ulcer/skin breakdown will have a volume reduction of 30% by week 4 Date Initiated: 04/08/2020 Target Resolution Date: 05/06/2020 Goal Status: Active Interventions: Provide education on ulcer and  skin care Notes: Electronic Signature(s) Signed: 04/08/2020 5:51:32 PM By: Cherylin Mylar Entered By: Cherylin Mylar on 04/08/2020 12:28:12 -------------------------------------------------------------------------------- Pain Assessment Details Patient Name: Date of Service: [name], [name] 04/08/2020 10:30 A M Medical Record Number: [id number] Patient Account Number: [id number] Date of Birth/Sex: Treating RN: Jan 18, 1946 (74 y.o. Freddy Finner Primary Care Kary Sugrue: Grafton Folk CO BS Other Clinician: Referring Dorann Davidson: Treating Anny Sayler/Extender: Sharlette Dense, JA CO BS Weeks in Treatment: 0 Active Problems Location of Pain Severity and Description of Pain Patient Has Paino No Site Locations Pain Management and Medication Current Pain Management: Electronic Signature(s) Signed: 04/08/2020 5:42:58 PM By: Yevonne Pax RN Entered By: Yevonne Pax on 04/08/2020 12:18:09 -------------------------------------------------------------------------------- Patient/Caregiver Education Details Patient Name: Date of Service: [name], [name] 7/23/2021andnbsp10:30 A M Medical Record  Number: [id number] Patient Account Number: [id number] Date of Birth/Gender: Treating RN: 02-24-1946 (74 y.o. Sabrina DarkF) Dwiggins, Shannon Primary Care Physician: Grafton FolkREEK, JA CO BS Other Clinician: Referring Physician: Treating Physician/Extender: Ulyses Amorobson, Michael CREEK, JA CO BS Weeks in Treatment: 0 Education Assessment Education Provided To: Caregiver Education Topics Provided Elevated Blood Sugar/ Impact on Healing: Handouts: Elevated Blood Sugars: How Do They Affect Wound Healing Methods: Explain/Verbal Responses: State content correctly Pain: Handouts: A Guide to Pain Control Methods: Explain/Verbal Responses: State content correctly Wound/Skin Impairment: Handouts: Caring for Your Ulcer Methods: Explain/Verbal Responses: State content correctly Electronic Signature(s) Signed:  04/08/2020 5:51:32 PM By: Cherylin Mylarwiggins, Shannon Entered By: Cherylin Mylarwiggins, Shannon on 04/08/2020 12:28:31 -------------------------------------------------------------------------------- Wound Assessment Details Patient Name: Date of Service: [name], [name] 04/08/2020 10:30 A M Medical Record Number: [id number] Patient Account Number: [id number] Date of Birth/Sex: Treating RN: 02-24-1946 (74 y.o. Sharyne PeachF) Epps, Carrie Primary Care Damek Ende: Grafton FolkREEK, JA CO BS Other Clinician: Referring Braylynn Lewing: Treating Javeon Macmurray/Extender: Sharlette Denseobson, Michael CREEK, JA CO BS Weeks in Treatment: 0 Wound Status Wound Number: 1 Primary Diabetic Wound/Ulcer of the Lower Extremity Etiology: Wound Location: Left, Anterior T Great oe Wound Open Wounding Event: Gradually Appeared Status: Date Acquired: 12/17/2019 Comorbid Chronic Obstructive Pulmonary Disease (COPD), Congestive Weeks Of Treatment: 0 History: Heart Failure, Hypertension, Peripheral Venous Disease, Type II Clustered Wound: No Diabetes Photos Photo Uploaded By: Benjaman KindlerJones, Dedrick on 04/08/2020 13:29:25 Wound Measurements Length: (cm) 0.3 Width: (cm) 0.3 Depth: (cm) 0.1 Area: (cm) 0.071 Volume: (cm) 0.007 % Reduction in Area: % Reduction in Volume: Epithelialization: None Tunneling: No Undermining: No Wound Description Classification: Grade 2 Exudate Amount: Medium Exudate Type: Serosanguineous Exudate Color: red, brown Foul Odor After Cleansing: No Slough/Fibrino Yes Wound Bed Granulation Amount: None Present (0%) Exposed Structure Necrotic Amount: Large (67-100%) Fascia Exposed: No Necrotic Quality: Adherent Slough Fat Layer (Subcutaneous Tissue) Exposed: No Tendon Exposed: No Muscle Exposed: No Joint Exposed: No Bone Exposed: No Treatment Notes Wound #1 (Left, Anterior Toe Great) 1. Cleanse With Wound Cleanser 3. Primary Dressing Applied Santyl 4. Secondary Dressing Dry Gauze Roll Gauze Heel Cup 5. Secured With Music therapistTape Electronic  Signature(s) Signed: 04/08/2020 5:42:58 PM By: Yevonne PaxEpps, Carrie RN Entered By: Yevonne PaxEpps, Carrie on 04/08/2020 12:02:12 -------------------------------------------------------------------------------- Wound Assessment Details Patient Name: Date of Service: [name], Marjory 04/08/2020 10:30 A M Medical Record Number: [id number] Patient Account Number: [id number] Date of Birth/Sex: Treating RN: 02-24-1946 (74 y.o. Sharyne PeachF) Epps, Carrie Primary Care Wendal Wilkie: Grafton FolkREEK, JA CO BS Other Clinician: Referring Kaniesha Barile: Treating Chrystal Zeimet/Extender: Sharlette Denseobson, Michael CREEK, JA CO BS Weeks in Treatment: 0 Wound Status Wound Number: 10 Primary Diabetic Wound/Ulcer of the Lower Extremity Etiology: Wound Location: Right T Fourth oe Wound Open Wounding Event: Gradually Appeared Status: Date Acquired: 12/17/2019 Comorbid Chronic Obstructive Pulmonary Disease (COPD), Congestive Weeks Of Treatment: 0 History: Heart Failure, Hypertension, Peripheral Venous Disease, Type II Clustered Wound: No Diabetes Photos Photo Uploaded By: Benjaman KindlerJones, Dedrick on 04/08/2020 13:28:46 Wound Measurements Length: (cm) 5 Width: (cm) 0.5 Depth: (cm) 0.1 Area: (cm) 1.963 Volume: (cm) 0.196 % Reduction in Area: % Reduction in Volume: Epithelialization: None Tunneling: No Undermining: No Wound Description Classification: Grade 2 Exudate Amount: Medium Exudate Type: Serosanguineous Exudate Color: red, brown Foul Odor After Cleansing: No Slough/Fibrino Yes Wound Bed Granulation Amount: None Present (0%) Exposed Structure Necrotic Amount: Large (67-100%) Fascia Exposed: No Necrotic Quality: Adherent Slough Fat Layer (Subcutaneous Tissue) Exposed: No Tendon Exposed: No Muscle Exposed: No Joint Exposed: No Bone Exposed: No Treatment Notes Wound #10 (Right Toe Fourth) 1. Cleanse With Wound Cleanser  3. Primary Dressing Applied Santyl 4. Secondary Dressing Dry Gauze Roll Gauze Heel Cup 5. Secured With Music therapist) Signed: 04/08/2020 5:42:58 PM By: Yevonne Pax RN Entered By: Yevonne Pax on 04/08/2020 12:15:43 -------------------------------------------------------------------------------- Wound Assessment Details Patient Name: Date of Service: [name], [name] 04/08/2020 10:30 A M Medical Record Number: [id number] Patient Account Number: [id number] Date of Birth/Sex: Treating RN: 1945-10-19 (74 y.o. Sharyne Peach, Carrie Primary Care Mohmed Farver: Grafton Folk CO BS Other Clinician: Referring Autumn Pruitt: Treating Tyffany Waldrop/Extender: Sharlette Dense, JA CO BS Weeks in Treatment: 0 Wound Status Wound Number: 11 Primary Diabetic Wound/Ulcer of the Lower Extremity Etiology: Wound Location: Right T Fifth oe Wound Open Wounding Event: Gradually Appeared Status: Date Acquired: 12/17/2019 Comorbid Chronic Obstructive Pulmonary Disease (COPD), Congestive Weeks Of Treatment: 0 History: Heart Failure, Hypertension, Peripheral Venous Disease, Type II Clustered Wound: No Diabetes Photos Photo Uploaded By: Benjaman Kindler on 04/08/2020 13:28:46 Wound Measurements Length: (cm) 0.4 Width: (cm) 0.4 Depth: (cm) 0.1 Area: (cm) 0.126 Volume: (cm) 0.013 % Reduction in Area: % Reduction in Volume: Epithelialization: None Tunneling: No Undermining: No Wound Description Classification: Grade 2 Wound Margin: Fibrotic scar, thickened scar Exudate Amount: Medium Exudate Type: Serosanguineous Exudate Color: red, brown Foul Odor After Cleansing: No Slough/Fibrino Yes Wound Bed Granulation Amount: None Present (0%) Exposed Structure Necrotic Amount: Large (67-100%) Fascia Exposed: No Necrotic Quality: Adherent Slough Fat Layer (Subcutaneous Tissue) Exposed: No Tendon Exposed: No Muscle Exposed: No Joint Exposed: No Bone Exposed: No Treatment Notes Wound #11 (Right Toe Fifth) 1. Cleanse With Wound Cleanser 3. Primary Dressing Applied Santyl 4. Secondary Dressing Dry Gauze Roll  Gauze Heel Cup 5. Secured With Secretary/administrator) Signed: 04/08/2020 5:42:58 PM By: Yevonne Pax RN Entered By: Yevonne Pax on 04/08/2020 12:16:42 -------------------------------------------------------------------------------- Wound Assessment Details Patient Name: Date of Service: [name], [name] 04/08/2020 10:30 A M Medical Record Number: [id number] Patient Account Number: [id number] Date of Birth/Sex: Treating RN: 1946-08-14 (74 y.o. Sharyne Peach, Carrie Primary Care Breiana Stratmann: Grafton Folk CO BS Other Clinician: Referring Omid Deardorff: Treating Hedda Crumbley/Extender: Sharlette Dense, JA CO BS Weeks in Treatment: 0 Wound Status Wound Number: 12 Primary Diabetic Wound/Ulcer of the Lower Extremity Etiology: Wound Location: Right Calcaneus Wound Open Wounding Event: Gradually Appeared Status: Date Acquired: 12/17/2019 Comorbid Chronic Obstructive Pulmonary Disease (COPD), Congestive Weeks Of Treatment: 0 History: Heart Failure, Hypertension, Peripheral Venous Disease, Type II Clustered Wound: No Diabetes Photos Photo Uploaded By: Benjaman Kindler on 04/08/2020 13:29:04 Wound Measurements Length: (cm) 2.1 Width: (cm) 1 Depth: (cm) 0.1 Area: (cm) 1.649 Volume: (cm) 0.165 % Reduction in Area: % Reduction in Volume: Epithelialization: None Tunneling: No Undermining: No Wound Description Classification: Grade 2 Wound Margin: Flat and Intact Exudate Amount: Medium Exudate Type: Serosanguineous Exudate Color: red, brown Foul Odor After Cleansing: No Slough/Fibrino Yes Wound Bed Granulation Amount: None Present (0%) Exposed Structure Necrotic Amount: Large (67-100%) Fascia Exposed: No Necrotic Quality: Adherent Slough Fat Layer (Subcutaneous Tissue) Exposed: No Tendon Exposed: No Muscle Exposed: No Joint Exposed: No Bone Exposed: No Treatment Notes Wound #12 (Right Calcaneus) 1. Cleanse With Wound Cleanser 3. Primary Dressing Applied Santyl 4. Secondary  Dressing Dry Gauze Roll Gauze Heel Cup 5. Secured With Secretary/administrator) Signed: 04/08/2020 5:42:58 PM By: Yevonne Pax RN Entered By: Yevonne Pax on 04/08/2020 12:17:57 -------------------------------------------------------------------------------- Wound Assessment Details Patient Name: Date of Service: [name], Jessamine 04/08/2020 10:30 A M Medical Record Number: [id number] Patient Account Number: [id number] Date of Birth/Sex: Treating RN: Feb 18, 1946 (74 y.o. F) Epps, Lyla Son  Primary Care Compton Brigance: Grafton Folk CO BS Other Clinician: Referring Garin Mata: Treating Leon Goodnow/Extender: Sharlette Dense, JA CO BS Weeks in Treatment: 0 Wound Status Wound Number: 2 Primary Diabetic Wound/Ulcer of the Lower Extremity Etiology: Wound Location: Right, Posterior T Great oe Wound Open Wounding Event: Gradually Appeared Status: Date Acquired: 12/17/2019 Comorbid Chronic Obstructive Pulmonary Disease (COPD), Congestive Weeks Of Treatment: 0 History: Heart Failure, Hypertension, Peripheral Venous Disease, Type II Clustered Wound: No Diabetes Photos Photo Uploaded By: Benjaman Kindler on 04/08/2020 13:09:44 Wound Measurements Length: (cm) 0.5 Width: (cm) 0.6 Depth: (cm) 0.1 Area: (cm) 0.236 Volume: (cm) 0.024 % Reduction in Area: % Reduction in Volume: Epithelialization: None Tunneling: No Undermining: No Wound Description Classification: Grade 2 Wound Margin: Fibrotic scar, thickened scar Exudate Amount: Medium Exudate Type: Serosanguineous Exudate Color: red, brown Foul Odor After Cleansing: No Slough/Fibrino Yes Wound Bed Granulation Amount: None Present (0%) Exposed Structure Necrotic Amount: Medium (34-66%) Fascia Exposed: No Necrotic Quality: Adherent Slough Fat Layer (Subcutaneous Tissue) Exposed: No Tendon Exposed: No Muscle Exposed: No Joint Exposed: No Bone Exposed: No Treatment Notes Wound #2 (Right, Posterior Toe Great) 1. Cleanse  With Wound Cleanser 3. Primary Dressing Applied Santyl 4. Secondary Dressing Dry Gauze Roll Gauze Heel Cup 5. Secured With Secretary/administrator) Signed: 04/08/2020 5:42:58 PM By: Yevonne Pax RN Entered By: Yevonne Pax on 04/08/2020 12:03:40 -------------------------------------------------------------------------------- Wound Assessment Details Patient Name: Date of Service: [name], Dynasia 04/08/2020 10:30 A M Medical Record Number: [id number] Patient Account Number: [id number] Date of Birth/Sex: Treating RN: [birth year] (74 y.o. Sharyne Peach, Carrie Primary Care Gavinn Collard: Grafton Folk CO BS Other Clinician: Referring Jaquavius Hudler: Treating Arelia Volpe/Extender: Sharlette Dense, JA CO BS Weeks in Treatment: 0 Wound Status Wound Number: 3 Primary Diabetic Wound/Ulcer of the Lower Extremity Etiology: Wound Location: Left T Second oe Wound Open Wounding Event: Gradually Appeared Status: Date Acquired: 12/17/2019 Comorbid Chronic Obstructive Pulmonary Disease (COPD), Congestive Weeks Of Treatment: 0 History: Heart Failure, Hypertension, Peripheral Venous Disease, Type II Clustered Wound: No Diabetes Photos Photo Uploaded By: Benjaman Kindler on 04/08/2020 13:09:45 Wound Measurements Length: (cm) 0.3 % Red Width: (cm) 0.3 % Red Depth: (cm) 0.1 Epith Area: (cm) 0.071 Tunn Volume: (cm) 0.007 Unde uction in Area: uction in Volume: elialization: None eling: No rmining: No Wound Description Classification: Grade 2 Foul Exudate Amount: Medium Sloug Exudate Type: Serosanguineous Exudate Color: red, brown Odor After Cleansing: No h/Fibrino Yes Wound Bed Granulation Amount: None Present (0%) Exposed Structure Necrotic Amount: Large (67-100%) Fascia Exposed: No Necrotic Quality: Adherent Slough Fat Layer (Subcutaneous Tissue) Exposed: No Tendon Exposed: No Muscle Exposed: No Joint Exposed: No Bone Exposed: No Treatment Notes Wound #3 (Left Toe Second) 1.  Cleanse With Wound Cleanser 3. Primary Dressing Applied Santyl 4. Secondary Dressing Dry Gauze Roll Gauze Heel Cup 5. Secured With Secretary/administrator) Signed: 04/08/2020 5:42:58 PM By: Yevonne Pax RN Entered By: Yevonne Pax on 04/08/2020 12:06:03 -------------------------------------------------------------------------------- Wound Assessment Details Patient Name: Date of Service: [name], Taite 04/08/2020 10:30 A M Medical Record Number: [id number] Patient Account Number: [id number] Date of Birth/Sex: Treating RN: 12/10/1945 (74 y.o. Freddy Finner Primary Care Savior Himebaugh: Grafton Folk CO BS Other Clinician: Referring Nickolette Espinola: Treating Quinnton Bury/Extender: Sharlette Dense, JA CO BS Weeks in Treatment: 0 Wound Status Wound Number: 4 Primary Diabetic Wound/Ulcer of the Lower Extremity Etiology: Wound Location: Left T Third oe Wound Open Wounding Event: Gradually Appeared Status: Date Acquired: 12/17/2019 Comorbid Chronic Obstructive Pulmonary Disease (COPD), Congestive Weeks Of Treatment: 0 History: Heart Failure,  Hypertension, Peripheral Venous Disease, Type II Clustered Wound: No Diabetes Photos Photo Uploaded By: Benjaman Kindler on 04/08/2020 13:23:32 Wound Measurements Length: (cm) 0.2 Width: (cm) 0.2 Depth: (cm) 0.1 Area: (cm) 0.031 Volume: (cm) 0.003 % Reduction in Area: % Reduction in Volume: Epithelialization: None Tunneling: No Undermining: No Wound Description Classification: Grade 2 Wound Margin: Flat and Intact Exudate Amount: Medium Exudate Type: Serosanguineous Exudate Color: red, brown Foul Odor After Cleansing: No Slough/Fibrino Yes Wound Bed Granulation Amount: None Present (0%) Exposed Structure Necrotic Amount: Large (67-100%) Fascia Exposed: No Necrotic Quality: Adherent Slough Fat Layer (Subcutaneous Tissue) Exposed: No Tendon Exposed: No Muscle Exposed: No Joint Exposed: No Bone Exposed: No Treatment  Notes Wound #4 (Left Toe Third) 1. Cleanse With Wound Cleanser 3. Primary Dressing Applied Santyl 4. Secondary Dressing Dry Gauze Roll Gauze Heel Cup 5. Secured With Secretary/administrator) Signed: 04/08/2020 5:42:58 PM By: Yevonne Pax RN Entered By: Yevonne Pax on 04/08/2020 12:08:52 -------------------------------------------------------------------------------- Wound Assessment Details Patient Name: Date of Service: [name], [name] 04/08/2020 10:30 A M Medical Record Number: [id number] Patient Account Number: [id number] Date of Birth/Sex: Treating RN: [birth year] (74 y.o. Sharyne Peach, Carrie Primary Care Mckay Brandt: Grafton Folk CO BS Other Clinician: Referring Annagrace Carr: Treating Nasiah Polinsky/Extender: Sharlette Dense, JA CO BS Weeks in Treatment: 0 Wound Status Wound Number: 5 Primary Diabetic Wound/Ulcer of the Lower Extremity Etiology: Wound Location: Left T Fourth oe Wound Open Wounding Event: Gradually Appeared Status: Date Acquired: 12/17/2019 Comorbid Chronic Obstructive Pulmonary Disease (COPD), Congestive Weeks Of Treatment: 0 History: Heart Failure, Hypertension, Peripheral Venous Disease, Type II Clustered Wound: No Diabetes Photos Photo Uploaded By: Benjaman Kindler on 04/08/2020 13:23:33 Wound Measurements Length: (cm) 1.5 Width: (cm) 2.1 Depth: (cm) 0.1 Area: (cm) 2.474 Volume: (cm) 0.247 % Reduction in Area: % Reduction in Volume: Epithelialization: None Tunneling: No Undermining: No Wound Description Classification: Grade 2 Exudate Amount: Medium Exudate Type: Serosanguineous Exudate Color: red, brown Foul Odor After Cleansing: No Slough/Fibrino Yes Wound Bed Granulation Amount: None Present (0%) Exposed Structure Necrotic Amount: Large (67-100%) Fascia Exposed: No Necrotic Quality: Eschar, Adherent Slough Fat Layer (Subcutaneous Tissue) Exposed: No Tendon Exposed: No Muscle Exposed: No Joint Exposed: No Bone Exposed:  No Treatment Notes Wound #5 (Left Toe Fourth) 1. Cleanse With Wound Cleanser 3. Primary Dressing Applied Santyl 4. Secondary Dressing Dry Gauze Roll Gauze Heel Cup 5. Secured With Secretary/administrator) Signed: 04/08/2020 5:42:58 PM By: Yevonne Pax RN Entered By: Yevonne Pax on 04/08/2020 12:10:34 -------------------------------------------------------------------------------- Wound Assessment Details Patient Name: Date of Service: [name], [place] 04/08/2020 10:30 A M Medical Record Number: [id number] Patient Account Number: [id number] Date of Birth/Sex: Treating RN: May 26, 1946 (75 y.o. Sharyne Peach, Carrie Primary Care Agastya Meister: Grafton Folk CO BS Other Clinician: Referring Maliek Schellhorn: Treating Toni Demo/Extender: Sharlette Dense, JA CO BS Weeks in Treatment: 0 Wound Status Wound Number: 6 Primary Diabetic Wound/Ulcer of the Lower Extremity Etiology: Wound Location: Left, Medial Foot Wound Open Wounding Event: Gradually Appeared Status: Date Acquired: 12/17/2019 Comorbid Chronic Obstructive Pulmonary Disease (COPD), Congestive Weeks Of Treatment: 0 History: Heart Failure, Hypertension, Peripheral Venous Disease, Type II Clustered Wound: No Diabetes Photos Photo Uploaded By: Benjaman Kindler on 04/08/2020 13:25:20 Wound Measurements Length: (cm) 1.1 Width: (cm) 1.1 Depth: (cm) 0.1 Area: (cm) 0.95 Volume: (cm) 0.095 % Reduction in Area: % Reduction in Volume: Epithelialization: None Tunneling: No Undermining: No Wound Description Classification: Grade 2 Exudate Amount: Medium Exudate Type: Serosanguineous Exudate Color: red, brown Foul Odor After Cleansing: No Slough/Fibrino Yes Wound Bed Granulation  Amount: None Present (0%) Exposed Structure Necrotic Amount: Large (67-100%) Fascia Exposed: No Necrotic Quality: Adherent Slough Fat Layer (Subcutaneous Tissue) Exposed: No Tendon Exposed: No Muscle Exposed: No Joint Exposed: No Bone Exposed:  No Treatment Notes Wound #6 (Left, Medial Foot) 1. Cleanse With Wound Cleanser 3. Primary Dressing Applied Santyl 4. Secondary Dressing Dry Gauze Roll Gauze Heel Cup 5. Secured With Secretary/administrator) Signed: 04/08/2020 5:42:58 PM By: Yevonne Pax RN Entered By: Yevonne Pax on 04/08/2020 12:11:40 -------------------------------------------------------------------------------- Wound Assessment Details Patient Name: Date of Service: [name], Janiece 04/08/2020 10:30 A M Medical Record Number: [id number] Patient Account Number: [id number] Date of Birth/Sex: Treating RN: 09-Jul-1946 (74 y.o. Sharyne Peach, Carrie Primary Care Annaleigh Steinmeyer: Grafton Folk CO BS Other Clinician: Referring Nelle Sayed: Treating Jolita Haefner/Extender: Sharlette Dense, JA CO BS Weeks in Treatment: 0 Wound Status Wound Number: 7 Primary Diabetic Wound/Ulcer of the Lower Extremity Etiology: Wound Location: Left, Lateral Foot Wound Open Wounding Event: Gradually Appeared Status: Date Acquired: 12/17/2019 Comorbid Chronic Obstructive Pulmonary Disease (COPD), Congestive Weeks Of Treatment: 0 History: Heart Failure, Hypertension, Peripheral Venous Disease, Type II Clustered Wound: No Diabetes Photos Photo Uploaded By: Benjaman Kindler on 04/08/2020 13:25:21 Wound Measurements Length: (cm) 2.5 Width: (cm) 2.5 Depth: (cm) 0.2 Area: (cm) 4.909 Volume: (cm) 0.982 % Reduction in Area: % Reduction in Volume: Epithelialization: None Tunneling: No Undermining: No Wound Description Classification: Grade 2 Wound Margin: Fibrotic scar, thickened scar Exudate Amount: Medium Exudate Type: Serosanguineous Exudate Color: red, brown Foul Odor After Cleansing: No Slough/Fibrino Yes Wound Bed Necrotic Amount: Large (67-100%) Exposed Structure Necrotic Quality: Eschar, Adherent Slough Fascia Exposed: No Fat Layer (Subcutaneous Tissue) Exposed: No Tendon Exposed: No Muscle Exposed: No Joint Exposed:  No Bone Exposed: No Treatment Notes Wound #7 (Left, Lateral Foot) 1. Cleanse With Wound Cleanser 3. Primary Dressing Applied Santyl 4. Secondary Dressing Dry Gauze Roll Gauze Heel Cup 5. Secured With Secretary/administrator) Signed: 04/08/2020 5:42:58 PM By: Yevonne Pax RN Entered By: Yevonne Pax on 04/08/2020 12:12:43 -------------------------------------------------------------------------------- Wound Assessment Details Patient Name: Date of Service: [name], [place] 04/08/2020 10:30 A M Medical Record Number: [id number] Patient Account Number: [id number] Date of Birth/Sex: Treating RN: June 21, 1946 (74 y.o. Sharyne Peach, Carrie Primary Care Symeon Puleo: Grafton Folk CO BS Other Clinician: Referring Chalise Pe: Treating Mc Bloodworth/Extender: Sharlette Dense, JA CO BS Weeks in Treatment: 0 Wound Status Wound Number: 8 Primary Diabetic Wound/Ulcer of the Lower Extremity Etiology: Wound Location: Left Calcaneus Wound Open Wounding Event: Gradually Appeared Status: Date Acquired: 12/17/2019 Comorbid Chronic Obstructive Pulmonary Disease (COPD), Congestive Weeks Of Treatment: 0 History: Heart Failure, Hypertension, Peripheral Venous Disease, Type II Clustered Wound: No Diabetes Photos Photo Uploaded By: Benjaman Kindler on 04/08/2020 13:25:59 Wound Measurements Length: (cm) 0.9 Width: (cm) 0.6 Depth: (cm) 0.1 Area: (cm) 0.424 Volume: (cm) 0.042 % Reduction in Area: % Reduction in Volume: Epithelialization: None Tunneling: No Undermining: No Wound Description Classification: Grade 2 Exudate Amount: Medium Foul Odor After Cleansing: No Slough/Fibrino Yes Wound Bed Granulation Amount: None Present (0%) Exposed Structure Necrotic Amount: Large (67-100%) Fascia Exposed: No Necrotic Quality: Adherent Slough Fat Layer (Subcutaneous Tissue) Exposed: No Tendon Exposed: No Muscle Exposed: No Joint Exposed: No Bone Exposed: No Treatment Notes Wound #8 (Left  Calcaneus) 1. Cleanse With Wound Cleanser 3. Primary Dressing Applied Santyl 4. Secondary Dressing Dry Gauze Roll Gauze Heel Cup 5. Secured With Secretary/administrator) Signed: 04/08/2020 5:42:58 PM By: Yevonne Pax RN Entered By: Yevonne Pax on 04/08/2020 12:13:50 -------------------------------------------------------------------------------- Wound Assessment Details Patient Name:  Date of Service: [name], [name] 04/08/2020 10:30 A M Medical Record Number: [id number] Patient Account Number: [id number] Date of Birth/Sex: Treating RN: 1945/12/25 (74 y.o. Sharyne Peach, Carrie Primary Care Tuff Clabo: Grafton Folk CO BS Other Clinician: Referring Kail Fraley: Treating Anselm Aumiller/Extender: Sharlette Dense, JA CO BS Weeks in Treatment: 0 Wound Status Wound Number: 9 Primary Diabetic Wound/Ulcer of the Lower Extremity Etiology: Etiology: Wound Location: Right T Third oe Wound Open Wounding Event: Gradually Appeared Status: Date Acquired: 12/17/2019 Comorbid Chronic Obstructive Pulmonary Disease (COPD), Congestive Weeks Of Treatment: 0 History: Heart Failure, Hypertension, Peripheral Venous Disease, Type II Clustered Wound: No Diabetes Photos Photo Uploaded By: Benjaman Kindler on 04/08/2020 13:26:00 Wound Measurements Length: (cm) 0.3 Width: (cm) 0.3 Depth: (cm) 0.1 Area: (cm) 0.071 Volume: (cm) 0.007 % Reduction in Area: % Reduction in Volume: Epithelialization: None Tunneling: No Undermining: No Wound Description Classification: Grade 2 Wound Margin: Flat and Intact Exudate Amount: Medium Exudate Type: Serosanguineous Exudate Color: red, brown Foul Odor After Cleansing: No Slough/Fibrino Yes Wound Bed Granulation Amount: None Present (0%) Exposed Structure Necrotic Amount: Large (67-100%) Fascia Exposed: No Necrotic Quality: Adherent Slough Fat Layer (Subcutaneous Tissue) Exposed: No Tendon Exposed: No Muscle Exposed: No Joint Exposed: No Bone  Exposed: No Treatment Notes Wound #9 (Right Toe Third) 1. Cleanse With Wound Cleanser 3. Primary Dressing Applied Santyl 4. Secondary Dressing Dry Gauze Roll Gauze Heel Cup 5. Secured With Secretary/administrator) Signed: 04/08/2020 5:42:58 PM By: Yevonne Pax RN Entered By: Yevonne Pax on 04/08/2020 12:14:51 -------------------------------------------------------------------------------- Vitals Details Patient Name: Date of Service: [name], [name] 04/08/2020 10:30 A M Medical Record Number: [id number] Patient Account Number: [id number] Date of Birth/Sex: Treating RN: [birth year] (74 y.o. F) Dwiggins, Shannon Primary Care Treasure Ingrum: Grafton Folk CO BS Other Clinician: Referring Jolicia Delira: Treating Kelli Robeck/Extender: Sharlette Dense, JA CO BS Weeks in Treatment: 0 Vital Signs Time Taken: 11:01 Temperature (F): 97.6 Height (in): 62 Pulse (bpm): 86 Source: Stated Respiratory Rate (breaths/min): 18 Weight (lbs): 158 Blood Pressure (mmHg): 99/64 Source: Stated Reference Range: 80 - 120 mg / dl Body Mass Index (BMI): 28.9 Electronic Signature(s) Signed: 04/08/2020 5:42:58 PM By: Yevonne Pax RN Entered By: Yevonne Pax on 04/08/2020 11:19:41

## 2020-04-08 NOTE — Progress Notes (Signed)
[name], [name] ([id number]) Visit Report for 04/08/2020 Chief Complaint Document Details Patient Name: Date of Service: LO MBA RDI, [name] 04/08/2020 10:30 A M Medical Record Number: [id number] Patient Account Number: [id number] Date of Birth/Sex: Treating RN: [birth year] (74 y.o. Harvest DarkF) Dwiggins, Shannon Primary Care Provider: Grafton FolkREEK, JA CO BS Other Clinician: Referring Provider: Treating Provider/Extender: Sharlette Denseobson, Kerin Kren CREEK, JA CO BS Weeks in Treatment: 0 Information Obtained from: Patient Chief Complaint 04/08/2020; patient is here for review of wounds on her bilateral feet and heels Electronic Signature(s) Signed: 04/08/2020 5:20:32 PM By: Baltazar Najjarobson, Lamya Lausch MD Entered By: Baltazar Najjarobson, Terianne Thaker on 04/08/2020 12:58:30 -------------------------------------------------------------------------------- Debridement Details Patient Name: Date of Service: LO MBA RDI, [name] 04/08/2020 10:30 A M Medical Record Number: [id number] Patient Account Number: [id number] Date of Birth/Sex: Treating RN: [birth year] (74 y.o. Harvest DarkF) Dwiggins, Shannon Primary Care Provider: Grafton FolkREEK, JA CO BS Other Clinician: Referring Provider: Treating Provider/Extender: Sharlette Denseobson, Cristo Ausburn CREEK, JA CO BS Weeks in Treatment: 0 Debridement Performed for Assessment: Wound #1 Left,Anterior T Great oe Performed By: Physician Maxwell Caulobson, Izak Anding G., MD Debridement Type: Chemical/Enzymatic/Mechanical Agent Used: Santyl Severity of Tissue Pre Debridement: Fat layer exposed Level of Consciousness (Pre-procedure): Awake and Alert Pre-procedure Verification/Time Out No Taken: Bleeding: None Response to Treatment: Procedure was tolerated well Level of Consciousness (Post- Awake and Alert procedure): Post Debridement Measurements of Total Wound Length: (cm) 0.3 Width: (cm) 0.3 Depth: (cm) 0.1 Volume: (cm) 0.007 Character of Wound/Ulcer Post Debridement: Requires Further Debridement Severity of Tissue Post Debridement: Fat layer  exposed Post Procedure Diagnosis Same as Pre-procedure Electronic Signature(s) Signed: 04/08/2020 5:20:32 PM By: Baltazar Najjarobson, Fayette Gasner MD Signed: 04/08/2020 5:51:32 PM By: Cherylin Mylarwiggins, Shannon Entered By: Cherylin Mylarwiggins, Shannon on 04/08/2020 12:59:26 -------------------------------------------------------------------------------- Debridement Details Patient Name: Date of Service: LO MBA RDI, [name] 04/08/2020 10:30 A M Medical Record Number: [id number] Patient Account Number: [id number] Date of Birth/Sex: Treating RN: [birth year] (74 y.o. Harvest DarkF) Dwiggins, Shannon Primary Care Provider: Grafton FolkREEK, JA CO BS Other Clinician: Referring Provider: Treating Provider/Extender: Sharlette Denseobson, Alik Mawson CREEK, JA CO BS Weeks in Treatment: 0 Debridement Performed for Assessment: Wound #10 Right T Fourth oe Performed By: Physician Maxwell Caulobson, Kiaan Overholser G., MD Debridement Type: Chemical/Enzymatic/Mechanical Agent Used: Santyl Severity of Tissue Pre Debridement: Fat layer exposed Level of Consciousness (Pre-procedure): Awake and Alert Pre-procedure Verification/Time Out No Taken: Bleeding: None Response to Treatment: Procedure was tolerated well Level of Consciousness (Post- Awake and Alert procedure): Post Debridement Measurements of Total Wound Length: (cm) 5 Width: (cm) 0.5 Depth: (cm) 0.1 Volume: (cm) 0.196 Character of Wound/Ulcer Post Debridement: Requires Further Debridement Severity of Tissue Post Debridement: Fat layer exposed Post Procedure Diagnosis Same as Pre-procedure Electronic Signature(s) Signed: 04/08/2020 5:20:32 PM By: Baltazar Najjarobson, Amarion Portell MD Signed: 04/08/2020 5:51:32 PM By: Cherylin Mylarwiggins, Shannon Entered By: Cherylin Mylarwiggins, Shannon on 04/08/2020 12:59:57 -------------------------------------------------------------------------------- Debridement Details Patient Name: Date of Service: LO MBA RDI, [name] 04/08/2020 10:30 A M Medical Record Number: [id number] Patient Account Number: [id number] Date of  Birth/Sex: Treating RN: [birth year] (74 y.o. Harvest DarkF) Dwiggins, Shannon Primary Care Provider: Grafton FolkREEK, JA CO BS Other Clinician: Referring Provider: Treating Provider/Extender: Sharlette Denseobson, Laurance Heide CREEK, JA CO BS Weeks in Treatment: 0 Debridement Performed for Assessment: Wound #11 Right T Fifth oe Performed By: Physician Maxwell Caulobson, Jahzara Slattery G., MD Debridement Type: Chemical/Enzymatic/Mechanical Agent Used: Santyl Severity of Tissue Pre Debridement: Fat layer exposed Level of Consciousness (Pre-procedure): Awake and Alert Pre-procedure Verification/Time Out No Taken: Bleeding: None Response to Treatment: Procedure was tolerated well Level of Consciousness (Post- Awake and Alert procedure): Post Debridement Measurements of Total Wound Length: (cm) 0.4 Width: (cm) 0.4 Depth: (  cm) 0.1 Volume: (cm) 0.013 Character of Wound/Ulcer Post Debridement: Requires Further Debridement Severity of Tissue Post Debridement: Fat layer exposed Post Procedure Diagnosis Same as Pre-procedure Electronic Signature(s) Signed: 04/08/2020 5:20:32 PM By: Baltazar Najjar MD Signed: 04/08/2020 5:51:32 PM By: Cherylin Mylar Entered By: Cherylin Mylar on 04/08/2020 13:00:36 -------------------------------------------------------------------------------- Debridement Details Patient Name: Date of Service: LO MBA RDI, [name] 04/08/2020 10:30 A M Medical Record Number: [id number] Patient Account Number: [id number] Date of Birth/Sex: Treating RN: Jan 20, 1946 (74 y.o. Harvest Dark Primary Care Provider: Grafton Folk CO BS Other Clinician: Referring Provider: Treating Provider/Extender: Sharlette Dense, JA CO BS Weeks in Treatment: 0 Debridement Performed for Assessment: Wound #12 Right Calcaneus Performed By: Physician Maxwell Caul., MD Debridement Type: Chemical/Enzymatic/Mechanical Agent Used: Santyl Severity of Tissue Pre Debridement: Fat layer exposed Level of Consciousness (Pre-procedure):  Awake and Alert Pre-procedure Verification/Time Out No Taken: Bleeding: None Response to Treatment: Procedure was tolerated well Level of Consciousness (Post- Awake and Alert procedure): Post Debridement Measurements of Total Wound Length: (cm) 2.1 Width: (cm) 1 Depth: (cm) 0.1 Volume: (cm) 0.165 Character of Wound/Ulcer Post Debridement: Requires Further Debridement Severity of Tissue Post Debridement: Fat layer exposed Post Procedure Diagnosis Same as Pre-procedure Electronic Signature(s) Signed: 04/08/2020 5:20:32 PM By: Baltazar Najjar MD Signed: 04/08/2020 5:51:32 PM By: Cherylin Mylar Entered By: Cherylin Mylar on 04/08/2020 13:01:01 -------------------------------------------------------------------------------- Debridement Details Patient Name: Date of Service: LO MBA RDI, [name] 04/08/2020 10:30 A M Medical Record Number: [id number] Patient Account Number: [id number] Date of Birth/Sex: Treating RN: 09-08-1946 (74 y.o. Harvest Dark Primary Care Provider: Other Clinician: Marlene Bast BS Referring Provider: Treating Provider/Extender: Sharlette Dense, JA CO BS Weeks in Treatment: 0 Debridement Performed for Assessment: Wound #2 Right,Posterior T Great oe Performed By: Physician Maxwell Caul., MD Debridement Type: Chemical/Enzymatic/Mechanical Agent Used: Santyl Severity of Tissue Pre Debridement: Fat layer exposed Level of Consciousness (Pre-procedure): Awake and Alert Pre-procedure Verification/Time Out No Taken: Bleeding: None Response to Treatment: Procedure was tolerated well Level of Consciousness (Post- Awake and Alert procedure): Post Debridement Measurements of Total Wound Length: (cm) 0.5 Width: (cm) 0.6 Depth: (cm) 0.1 Volume: (cm) 0.024 Character of Wound/Ulcer Post Debridement: Requires Further Debridement Severity of Tissue Post Debridement: Fat layer exposed Post Procedure Diagnosis Same as Pre-procedure Electronic  Signature(s) Signed: 04/08/2020 5:20:32 PM By: Baltazar Najjar MD Signed: 04/08/2020 5:51:32 PM By: Cherylin Mylar Entered By: Cherylin Mylar on 04/08/2020 13:01:33 -------------------------------------------------------------------------------- Debridement Details Patient Name: Date of Service: LO MBA RDI, [name] 04/08/2020 10:30 A M Medical Record Number: [id number] Patient Account Number: [id number] Date of Birth/Sex: Treating RN: [birth year] (74 y.o. Harvest Dark Primary Care Provider: Grafton Folk CO BS Other Clinician: Referring Provider: Treating Provider/Extender: Sharlette Dense, JA CO BS Weeks in Treatment: 0 Debridement Performed for Assessment: Wound #3 Left T Second oe Performed By: Physician Maxwell Caul., MD Debridement Type: Chemical/Enzymatic/Mechanical Agent Used: Santyl Severity of Tissue Pre Debridement: Fat layer exposed Level of Consciousness (Pre-procedure): Awake and Alert Pre-procedure Verification/Time Out No Taken: Bleeding: None Response to Treatment: Procedure was tolerated well Level of Consciousness (Post- Awake and Alert procedure): Post Debridement Measurements of Total Wound Length: (cm) 0.3 Width: (cm) 0.3 Depth: (cm) 0.1 Volume: (cm) 0.007 Character of Wound/Ulcer Post Debridement: Requires Further Debridement Severity of Tissue Post Debridement: Fat layer exposed Post Procedure Diagnosis Same as Pre-procedure Electronic Signature(s) Signed: 04/08/2020 5:20:32 PM By: Baltazar Najjar MD Signed: 04/08/2020 5:51:32 PM By: Cherylin Mylar Entered By: Cherylin Mylar on 04/08/2020 13:02:09 -------------------------------------------------------------------------------- Debridement Details  Patient Name: Date of Service: LO MBA RDI, [name] 04/08/2020 10:30 A M Medical Record Number: [id number] Patient Account Number: [id number] Date of Birth/Sex: Treating RN: February 21, 1946 (74 y.o. Harvest Dark Primary Care  Provider: Grafton Folk CO BS Other Clinician: Referring Provider: Treating Provider/Extender: Sharlette Dense, JA CO BS Weeks in Treatment: 0 Debridement Performed for Assessment: Wound #4 Left T Third oe Performed By: Physician Maxwell Caul., MD Debridement Type: Chemical/Enzymatic/Mechanical Agent Used: Santyl Severity of Tissue Pre Debridement: Fat layer exposed Level of Consciousness (Pre-procedure): Awake and Alert Pre-procedure Verification/Time Out No Taken: Bleeding: None Response to Treatment: Procedure was tolerated well Level of Consciousness (Post- Awake and Alert procedure): Post Debridement Measurements of Total Wound Length: (cm) 0.2 Width: (cm) 0.2 Depth: (cm) 0.1 Volume: (cm) 0.003 Character of Wound/Ulcer Post Debridement: Requires Further Debridement Severity of Tissue Post Debridement: Fat layer exposed Post Procedure Diagnosis Same as Pre-procedure Electronic Signature(s) Signed: 04/08/2020 5:20:32 PM By: Baltazar Najjar MD Signed: 04/08/2020 5:51:32 PM By: Cherylin Mylar Entered By: Cherylin Mylar on 04/08/2020 13:02:34 -------------------------------------------------------------------------------- Debridement Details Patient Name: Date of Service: LO MBA RDI, [name] 04/08/2020 10:30 A M Medical Record Number: [id number] Patient Account Number: [id number] Date of Birth/Sex: Treating RN: [birth year] (74 y.o. Harvest Dark Primary Care Provider: Grafton Folk CO BS Other Clinician: Referring Provider: Treating Provider/Extender: Sharlette Dense, JA CO BS Weeks in Treatment: 0 Debridement Performed for Assessment: Wound #5 Left T Fourth oe Performed By: Physician Maxwell Caul., MD Debridement Type: Chemical/Enzymatic/Mechanical Agent Used: Santyl Severity of Tissue Pre Debridement: Fat layer exposed Level of Consciousness (Pre-procedure): Awake and Alert Pre-procedure Verification/Time Out No Taken: Bleeding:  None Response to Treatment: Procedure was tolerated well Level of Consciousness (Post- Level of Consciousness (Post- Awake and Alert procedure): Post Debridement Measurements of Total Wound Length: (cm) 1.5 Width: (cm) 2.1 Depth: (cm) 0.1 Volume: (cm) 0.247 Character of Wound/Ulcer Post Debridement: Requires Further Debridement Severity of Tissue Post Debridement: Fat layer exposed Post Procedure Diagnosis Same as Pre-procedure Electronic Signature(s) Signed: 04/08/2020 5:20:32 PM By: Baltazar Najjar MD Signed: 04/08/2020 5:51:32 PM By: Cherylin Mylar Entered By: Cherylin Mylar on 04/08/2020 13:03:12 -------------------------------------------------------------------------------- Debridement Details Patient Name: Date of Service: LO MBA RDI, [name] 04/08/2020 10:30 A M Medical Record Number: [id number] Patient Account Number: [id number] Date of Birth/Sex: Treating RN: 1946-08-05 (74 y.o. Harvest Dark Primary Care Provider: Grafton Folk CO BS Other Clinician: Referring Provider: Treating Provider/Extender: Sharlette Dense, JA CO BS Weeks in Treatment: 0 Debridement Performed for Assessment: Wound #6 Left,Medial Foot Performed By: Physician Maxwell Caul., MD Debridement Type: Chemical/Enzymatic/Mechanical Agent Used: Santyl Severity of Tissue Pre Debridement: Fat layer exposed Level of Consciousness (Pre-procedure): Awake and Alert Pre-procedure Verification/Time Out No Taken: Bleeding: None Response to Treatment: Procedure was tolerated well Level of Consciousness (Post- Awake and Alert procedure): Post Debridement Measurements of Total Wound Length: (cm) 1.1 Width: (cm) 1.1 Depth: (cm) 0.1 Volume: (cm) 0.095 Character of Wound/Ulcer Post Debridement: Requires Further Debridement Severity of Tissue Post Debridement: Fat layer exposed Post Procedure Diagnosis Same as Pre-procedure Electronic Signature(s) Signed: 04/08/2020 5:20:32 PM By:  Baltazar Najjar MD Signed: 04/08/2020 5:51:32 PM By: Cherylin Mylar Entered By: Cherylin Mylar on 04/08/2020 13:03:43 -------------------------------------------------------------------------------- Debridement Details Patient Name: Date of Service: LO MBA RDI, [name] 04/08/2020 10:30 A M Medical Record Number: [id number] Patient Account Number: [id number] Date of Birth/Sex: Treating RN: Apr 12, 1946 (74 y.o. Harvest Dark Primary Care Provider: Grafton Folk CO BS Other Clinician: Referring Provider: Treating Provider/Extender: Sharlette Dense, Fawn Kirk  CO BS Weeks in Treatment: 0 Debridement Performed for Assessment: Wound #7 Left,Lateral Foot Performed By: Physician Maxwell Caul., MD Debridement Type: Chemical/Enzymatic/Mechanical Agent Used: Santyl Severity of Tissue Pre Debridement: Fat layer exposed Level of Consciousness (Pre-procedure): Awake and Alert Pre-procedure Verification/Time Out No Taken: Bleeding: None Response to Treatment: Procedure was tolerated well Level of Consciousness (Post- Awake and Alert procedure): Post Debridement Measurements of Total Wound Length: (cm) 2.5 Width: (cm) 2.5 Depth: (cm) 0.2 Volume: (cm) 0.982 Character of Wound/Ulcer Post Debridement: Requires Further Debridement Severity of Tissue Post Debridement: Fat layer exposed Post Procedure Diagnosis Same as Pre-procedure Electronic Signature(s) Signed: 04/08/2020 5:20:32 PM By: Baltazar Najjar MD Signed: 04/08/2020 5:51:32 PM By: Cherylin Mylar Entered By: Cherylin Mylar on 04/08/2020 13:04:07 -------------------------------------------------------------------------------- Debridement Details Patient Name: Date of Service: LO MBA RDI, [name] 04/08/2020 10:30 A M Medical Record Number: [id number] Patient Account Number: [id number] Date of Birth/Sex: Treating RN: [birth year] (74 y.o. Harvest Dark Primary Care Provider: Grafton Folk CO BS Other  Clinician: Referring Provider: Treating Provider/Extender: Sharlette Dense, JA CO BS Weeks in Treatment: 0 Debridement Performed for Assessment: Wound #8 Left Calcaneus Performed By: Physician Maxwell Caul., MD Debridement Type: Chemical/Enzymatic/Mechanical Agent Used: Santyl Severity of Tissue Pre Debridement: Fat layer exposed Level of Consciousness (Pre-procedure): Awake and Alert Pre-procedure Verification/Time Out No Taken: Bleeding: None Response to Treatment: Procedure was tolerated well Level of Consciousness (Post- Awake and Alert procedure): Post Debridement Measurements of Total Wound Length: (cm) 0.9 Width: (cm) 0.6 Depth: (cm) 0.1 Volume: (cm) 0.042 Character of Wound/Ulcer Post Debridement: Requires Further Debridement Severity of Tissue Post Debridement: Fat layer exposed Post Procedure Diagnosis Same as Pre-procedure Electronic Signature(s) Signed: 04/08/2020 5:20:32 PM By: Baltazar Najjar MD Signed: 04/08/2020 5:51:32 PM By: Cherylin Mylar Entered By: Cherylin Mylar on 04/08/2020 13:04:33 -------------------------------------------------------------------------------- Debridement Details Patient Name: Date of Service: LO MBA RDI, [name] 04/08/2020 10:30 A M Medical Record Number: [id number] Patient Account Number: [id number] Date of Birth/Sex: Treating RN: 25-Mar-1946 (74 y.o. Harvest Dark Primary Care Provider: Grafton Folk CO BS Other Clinician: Referring Provider: Treating Provider/Extender: Sharlette Dense, JA CO BS Weeks in Treatment: 0 Debridement Performed for Assessment: Wound #9 Right T Third oe Performed By: Physician Maxwell Caul., MD Debridement Type: Chemical/Enzymatic/Mechanical Agent Used: Santyl Severity of Tissue Pre Debridement: Fat layer exposed Level of Consciousness (Pre-procedure): Awake and Alert Pre-procedure Verification/Time Out No Taken: Bleeding: None Response to Treatment: Procedure was  tolerated well Level of Consciousness (Post- Awake and Alert procedure): Post Debridement Measurements of Total Wound Length: (cm) 0.3 Width: (cm) 0.3 Depth: (cm) 0.1 Volume: (cm) 0.007 Character of Wound/Ulcer Post Debridement: Requires Further Debridement Severity of Tissue Post Debridement: Fat layer exposed Post Procedure Diagnosis Same as Pre-procedure Electronic Signature(s) Signed: 04/08/2020 5:20:32 PM By: Baltazar Najjar MD Signed: 04/08/2020 5:51:32 PM By: Cherylin Mylar Entered By: Cherylin Mylar on 04/08/2020 13:04:58 -------------------------------------------------------------------------------- HPI Details Patient Name: Date of Service: LO MBA RDI, [name] 04/08/2020 10:30 A M Medical Record Number: [id number] Patient Account Number: [id number] Date of Birth/Sex: Treating RN: October 18, 1945 (74 y.o. 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 She is a resident of [facility] apparently since April 2021. We have contacted the facility apparently she arrived in the facility in April with wounds pretty  much similar to what she has currently. Beyond this the history is really unknown. Appropriately they eventually referred this patient to Dr. Durwin Nora who saw her on 02/24/2020 feeling she had critical bilateral limb ischemia. On 03/25/2020 she underwent an angiogram by Dr. Durwin Nora. On the right side she had a small deep femoral artery. Diffuse disease of the superficial femoral which was occluded in the mid thigh there was reconstitution of the disease popliteal artery above the knee. The below-knee popliteal artery was patent and there was three-vessel runoff via small arteries. On the left  side the common femoral artery was occluded in the left. There was reconstitution of a small deep femoral artery there was reconstitution of the superficial femoral artery distally which already had moderate to severe disease. The above-knee popliteal artery was diseased the below-knee popliteal artery was patent and once again there was three-vessel runoff on the left which were all small vessels. Dr. Durwin Nora was quite clear in his note that she was not a candidate for bypass and was not a candidate for an endovascular approach. He felt that the if the wounds progressed where she became systemically unwell bilateral amputations would be necessary versus palliative care The patient comes in today with multiple wounds on her bilateral toes and both heels laterally. Past medical history includes a colostomy and a Foley catheter. I am not sure what the issue is here. Apparently vascular dementia, coronary artery disease, paroxysmal A. fib on Eliquis, stage III chronic renal failure, type 2 diabetes, history of TIA and PAD. ABI in our clinic on the right was 0.4 on the left it was not obtainable. Electronic Signature(s) Signed: 04/08/2020 5:20:32 PM By: Baltazar Najjar MD Entered By: Baltazar Najjar on 04/08/2020 13:02:05 -------------------------------------------------------------------------------- Physical Exam Details Patient Name: Date of Service: LO MBA RDI, [name] 04/08/2020 10:30 A M Medical Record Number: [id number] Patient Account Number: [id number] Date of Birth/Sex: Treating RN: 1946-01-11 (74 y.o. Harvest Dark Primary Care Provider: Grafton Folk CO BS Other Clinician: Referring Provider: Treating Provider/Extender: Sharlette Dense, JA CO BS Weeks in Treatment: 0 Constitutional Patient is hypotensive. However she appears stable. Pulse regular and within target range for patient.Marland Kitchen Respirations regular, non-labored and within target range.. Temperature is normal and within the  target range for the patient.Marland Kitchen Appears in no distress. Respiratory work of breathing is normal. Bilateral breath sounds are clear and equal in all lobes with no wheezes, rales or rhonchi.. Cardiovascular Heart rhythm and rate regular, without murmur or gallop.. I believe there was a faint left popliteal pulse but none on the right. I could not feel either femoral pulse. Pedal pulses absent bilaterally.. Gastrointestinal (GI) Colostomy in place no masses.. Genitourinary (GU) Foley catheter noted. Neurological Even with the dementia using a tuning fork I am quite certain this patient has severe diabetic neuropathy.. Notes Wound exam On the left our intake nurse counted 8 wounds. These are small punched out areas on the left first second and fourth toes the left medial MTP and 2 on the left calcaneus On the right the right third fourth and fifth toes and the lateral calcaneus. Electronic Signature(s) Signed: 04/08/2020 5:20:32 PM By: Baltazar Najjar MD Entered By: Baltazar Najjar on 04/08/2020 13:03:53 -------------------------------------------------------------------------------- Physician Orders Details Patient Name: Date of Service: LO MBA RDI, [name] 04/08/2020 10:30 A M Medical Record Number: [id number] Patient Account Number: [id number] Date of Birth/Sex: Treating RN: 01-11-1946 (74 y.o. F) Cherylin Mylar Primary Care Provider: Grafton Folk CO BS Other Clinician: Referring Provider: Treating Provider/Extender: Leanord Hawking  Lucie Leather, JA CO BS Weeks in Treatment: 0 Verbal / Phone Orders: No Diagnosis Coding Follow-up Appointments Return appointment in 3 weeks. Dressing Change Frequency Wound #1 Left,Anterior T Great oe Change dressing every day. Wound #10 Right T Fourth oe Change dressing every day. Wound #11 Right T Fifth oe Change dressing every day. Wound #12 Right Calcaneus Change dressing every day. Wound #2 Right,Posterior T Great oe Change dressing every  day. Wound #3 Left T Second oe Change dressing every day. Wound #4 Left T Third oe Change dressing every day. Wound #5 Left T Fourth oe Change dressing every day. Wound #6 Left,Medial Foot Change dressing every day. Wound #7 Left,Lateral Foot Change dressing every day. Wound #8 Left Calcaneus Change dressing every day. Wound #9 Right T Third oe Change dressing every day. Wound Cleansing Clean wound with Wound Cleanser Primary Wound Dressing Wound #1 Left,Anterior T Great oe Santyl Ointment Wound #10 Right T Fourth oe Santyl Ointment Wound #11 Right T Fifth oe Santyl Ointment Wound #12 Right Calcaneus Santyl Ointment Wound #2 Right,Posterior T Great oe Santyl Ointment Wound #3 Left T Second oe Santyl Ointment Wound #4 Left T Third oe Santyl Ointment Wound #5 Left T Fourth oe Santyl Ointment Wound #6 Left,Medial Foot Santyl Ointment Wound #7 Left,Lateral Foot Santyl Ointment Wound #8 Left Calcaneus Santyl Ointment Wound #9 Right T Third oe Santyl Ointment Secondary Dressing Wound #1 Left,Anterior T Great oe Kerlix/Rolled Gauze Dry Gauze - separate toes with 4x4 as well Heel Cup Wound #10 Right T Fourth oe Kerlix/Rolled Gauze Dry Gauze - separate toes with 4x4 as well Heel Cup Wound #11 Right T Fifth oe Kerlix/Rolled Gauze Dry Gauze - separate toes with 4x4 as well Heel Cup Wound #12 Right Calcaneus Kerlix/Rolled Gauze Dry Gauze - separate toes with 4x4 as well Heel Cup Wound #2 Right,Posterior T Great oe Kerlix/Rolled Gauze Dry Gauze - separate toes with 4x4 as well Heel Cup Wound #3 Left T Second oe Kerlix/Rolled Gauze Dry Gauze - separate toes with 4x4 as well Heel Cup Wound #4 Left T Third oe Kerlix/Rolled Gauze Dry Gauze - separate toes with 4x4 as well Heel Cup Wound #5 Left T Fourth oe Kerlix/Rolled Gauze Dry Gauze - separate toes with 4x4 as well Heel Cup Wound #6 Left,Medial Foot Kerlix/Rolled Gauze Dry Gauze -  separate toes with 4x4 as well Heel Cup Wound #7 Left,Lateral Foot Kerlix/Rolled Gauze Dry Gauze - separate toes with 4x4 as well Heel Cup Wound #8 Left Calcaneus Kerlix/Rolled Gauze Dry Gauze - separate toes with 4x4 as well Heel Cup Wound #9 Right T Third oe Kerlix/Rolled Gauze Dry Gauze - separate toes with 4x4 as well Heel Cup Electronic Signature(s) Signed: 04/08/2020 5:20:32 PM By: Baltazar Najjar MD Signed: 04/08/2020 5:51:32 PM By: Cherylin Mylar Entered By: Cherylin Mylar on 04/08/2020 12:38:25 -------------------------------------------------------------------------------- Problem List Details Patient Name: Date of Service: LO MBA RDI, Adalie 04/08/2020 10:30 A M Medical Record Number: [id number] Patient Account Number: [id number] Date of Birth/Sex: Treating RN: [birth year] (74 y.o. Harvest Dark Primary Care Provider: Grafton Folk CO BS Other Clinician: Referring Provider: Treating Provider/Extender: Sharlette Dense, JA CO BS Weeks in Treatment: 0 Active Problems ICD-10 Encounter Code Description Active Date MDM Diagnosis E11.621 Type 2 diabetes mellitus with foot ulcer 04/08/2020 No Yes E11.51 Type 2 diabetes mellitus with diabetic peripheral angiopathy without gangrene 04/08/2020 No Yes E11.42 Type 2 diabetes mellitus with diabetic polyneuropathy 04/08/2020 No Yes L97.528 Non-pressure chronic ulcer of other part of left foot with  other specified 04/08/2020 No Yes severity L97.518 Non-pressure chronic ulcer of other part of right foot with other specified 04/08/2020 No Yes severity Inactive Problems Resolved Problems Electronic Signature(s) Signed: 04/08/2020 5:20:32 PM By: Baltazar Najjar MD Entered By: Baltazar Najjar on 04/08/2020 12:57:38 -------------------------------------------------------------------------------- Progress Note Details Patient Name: Date of Service: LO MBA RDI, [name] 04/08/2020 10:30 A M Medical Record Number:  [id number] Patient Account Number: [id number] Date of Birth/Sex: Treating RN: 02-05-1946 (74 y.o. 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 She is a resident of [facility] apparently since April 2021. We have contacted the facility apparently she arrived in the facility in April with wounds pretty much similar to what she has currently. Beyond this the history is really unknown. Appropriately they eventually referred this patient to Dr. Durwin Nora who saw her on 02/24/2020 feeling she had critical bilateral limb ischemia. On 03/25/2020 she underwent an angiogram by Dr. Durwin Nora. On the right side she had a small deep femoral artery. Diffuse disease of the superficial femoral which was occluded in the mid thigh there was reconstitution of the disease popliteal artery above the knee. The below-knee popliteal artery was patent and there was three-vessel runoff via small arteries. On the left side the common femoral artery was occluded in the left. There was reconstitution of a small deep femoral artery there was reconstitution of the superficial femoral artery distally which already had moderate to severe disease. The above-knee popliteal artery was diseased the below-knee popliteal artery was patent and once again there was three-vessel runoff on the left which were all small vessels. Dr. Durwin Nora was quite clear in his note that she was not a candidate for bypass and was not a candidate for an endovascular approach. He felt that the if the wounds progressed where she became systemically unwell  bilateral amputations would be necessary versus palliative care The patient comes in today with multiple wounds on her bilateral toes and both heels laterally. Past medical history includes a colostomy and a Foley catheter. I am not sure what the issue is here. Apparently vascular dementia, coronary artery disease, paroxysmal A. fib on Eliquis, stage III chronic renal failure, type 2 diabetes, history of TIA and PAD. ABI in our clinic on the right was 0.4 on the left it was not obtainable. Patient History Allergies codeine, erythromycin base, Sulfa (Sulfonamide Antibiotics) Family History Unknown History. Social History Former smoker, Alcohol Use - Never, Drug Use - No History, Caffeine Use - Rarely. Medical History Eyes Denies history of Cataracts, Glaucoma, Optic Neuritis Ear/Nose/Mouth/Throat Denies history of Chronic sinus problems/congestion, Middle ear problems Hematologic/Lymphatic Denies history of Anemia, Hemophilia, Human Immunodeficiency Virus, Lymphedema, Sickle Cell Disease Respiratory Patient has history of Chronic Obstructive Pulmonary Disease (COPD) Denies history of Aspiration, Asthma, Pneumothorax, Sleep Apnea, Tuberculosis Cardiovascular Patient has history of Congestive Heart Failure, Hypertension, Peripheral Venous Disease Denies history of Angina, Arrhythmia, Coronary Artery Disease, Deep Vein Thrombosis, Hypotension, Myocardial Infarction, Peripheral Arterial Disease, Phlebitis, Vasculitis Gastrointestinal Denies history of Cirrhosis , Colitis, Crohnoos, Hepatitis A, Hepatitis B, Hepatitis C Endocrine Patient has history of Type II Diabetes Denies history of Type I Diabetes Genitourinary Denies history of End  Stage Renal Disease Immunological Denies history of Lupus Erythematosus, Raynaudoos, Scleroderma Integumentary (Skin) Denies history of History of Burn Musculoskeletal Denies history of Gout, Rheumatoid Arthritis, Osteoarthritis,  Osteomyelitis Neurologic Denies history of Dementia, Neuropathy, Quadriplegia, Paraplegia, Seizure Disorder Oncologic Denies history of Received Chemotherapy, Received Radiation Psychiatric Denies history of Anorexia/bulimia, Confinement Anxiety Patient is treated with Insulin. Blood sugar is not tested. Review of Systems (ROS) Constitutional Symptoms (General Health) Denies complaints or symptoms of Fatigue, Fever, Chills, Marked Weight Change. Eyes Denies complaints or symptoms of Dry Eyes, Vision Changes, Glasses / Contacts. Ear/Nose/Mouth/Throat Denies complaints or symptoms of Chronic sinus problems or rhinitis. Respiratory Denies complaints or symptoms of Chronic or frequent coughs, Shortness of Breath. Cardiovascular Denies complaints or symptoms of Chest pain. Gastrointestinal Denies complaints or symptoms of Frequent diarrhea, Nausea, Vomiting. Endocrine Denies complaints or symptoms of Heat/cold intolerance. Genitourinary Denies complaints or symptoms of Frequent urination. Integumentary (Skin) Complains or has symptoms of Wounds. Musculoskeletal Denies complaints or symptoms of Muscle Pain, Muscle Weakness. Neurologic Denies complaints or symptoms of Numbness/parasthesias. Psychiatric Denies complaints or symptoms of Claustrophobia, Suicidal. Objective Constitutional Patient is hypotensive. However she appears stable. Pulse regular and within target range for patient.Marland Kitchen Respirations regular, non-labored and within target range.. Temperature is normal and within the target range for the patient.Marland Kitchen Appears in no distress. Vitals Time Taken: 11:01 AM, Height: 62 in, Source: Stated, Weight: 158 lbs, Source: Stated, BMI: 28.9, Temperature: 97.6 F, Pulse: 86 bpm, Respiratory Rate: 18 breaths/min, Blood Pressure: 99/64 mmHg. Respiratory work of breathing is normal. Bilateral breath sounds are clear and equal in all lobes with no wheezes, rales or  rhonchi.. Cardiovascular Heart rhythm and rate regular, without murmur or gallop.. I believe there was a faint left popliteal pulse but none on the right. I could not feel either femoral pulse. Pedal pulses absent bilaterally.. Gastrointestinal (GI) Colostomy in place no masses.. Genitourinary (GU) Foley catheter noted. Neurological Even with the dementia using a tuning fork I am quite certain this patient has severe diabetic neuropathy.. General Notes: Wound exam ooOn the left our intake nurse counted 8 wounds. These are small punched out areas on the left first second and fourth toes the left medial MTP and 2 on the left calcaneus ooOn the right the right third fourth and fifth toes and the lateral calcaneus. Integumentary (Hair, Skin) Wound #1 status is Open. Original cause of wound was Gradually Appeared. The wound is located on the Left,Anterior T [place]. The wound measures 0.3cm oe length x 0.3cm width x 0.1cm depth; 0.071cm^2 area and 0.007cm^3 volume. There is no tunneling or undermining noted. There is a medium amount of serosanguineous drainage noted. There is no granulation within the wound bed. There is a large (67-100%) amount of necrotic tissue within the wound bed including Adherent Slough. Wound #10 status is Open. Original cause of wound was Gradually Appeared. The wound is located on the Right T Fourth. The wound measures 5cm length x oe 0.5cm width x 0.1cm depth; 1.963cm^2 area and 0.196cm^3 volume. There is no tunneling or undermining noted. There is a medium amount of serosanguineous drainage noted. There is no granulation within the wound bed. There is a large (67-100%) amount of necrotic tissue within the wound bed including Adherent Slough. Wound #11 status is Open. Original cause of wound was Gradually Appeared. The wound is located on the Right T Fifth. The wound measures 0.4cm length x oe 0.4cm width x 0.1cm depth; 0.126cm^2 area and 0.013cm^3 volume. There is no  tunneling or  undermining noted. There is a medium amount of serosanguineous drainage noted. The wound margin is fibrotic, thickened scar. There is no granulation within the wound bed. There is a large (67-100%) amount of necrotic tissue within the wound bed including Adherent Slough. Wound #12 status is Open. Original cause of wound was Gradually Appeared. The wound is located on the Right Calcaneus. The wound measures 2.1cm length x 1cm width x 0.1cm depth; 1.649cm^2 area and 0.165cm^3 volume. There is no tunneling or undermining noted. There is a medium amount of serosanguineous drainage noted. The wound margin is flat and intact. There is no granulation within the wound bed. There is a large (67-100%) amount of necrotic tissue within the wound bed including Adherent Slough. Wound #2 status is Open. Original cause of wound was Gradually Appeared. The wound is located on the Right,Posterior T [place]. The wound measures oe 0.5cm length x 0.6cm width x 0.1cm depth; 0.236cm^2 area and 0.024cm^3 volume. There is no tunneling or undermining noted. There is a medium amount of serosanguineous drainage noted. The wound margin is fibrotic, thickened scar. There is no granulation within the wound bed. There is a medium (34-66%) amount of necrotic tissue within the wound bed including Adherent Slough. Wound #3 status is Open. Original cause of wound was Gradually Appeared. The wound is located on the Left T Second. The wound measures 0.3cm length x oe 0.3cm width x 0.1cm depth; 0.071cm^2 area and 0.007cm^3 volume. There is no tunneling or undermining noted. There is a medium amount of serosanguineous drainage noted. There is no granulation within the wound bed. There is a large (67-100%) amount of necrotic tissue within the wound bed including Adherent Slough. Wound #4 status is Open. Original cause of wound was Gradually Appeared. The wound is located on the Left T Third. The wound measures 0.2cm length  x oe 0.2cm width x 0.1cm depth; 0.031cm^2 area and 0.003cm^3 volume. There is no tunneling or undermining noted. There is a medium amount of serosanguineous drainage noted. The wound margin is flat and intact. There is no granulation within the wound bed. There is a large (67-100%) amount of necrotic tissue within the wound bed including Adherent Slough. Wound #5 status is Open. Original cause of wound was Gradually Appeared. The wound is located on the Left T Fourth. The wound measures 1.5cm length x oe 2.1cm width x 0.1cm depth; 2.474cm^2 area and 0.247cm^3 volume. There is no tunneling or undermining noted. There is a medium amount of serosanguineous drainage noted. There is no granulation within the wound bed. There is a large (67-100%) amount of necrotic tissue within the wound bed including Eschar and Adherent Slough. Wound #6 status is Open. Original cause of wound was Gradually Appeared. The wound is located on the Left,Medial Foot. The wound measures 1.1cm length x 1.1cm width x 0.1cm depth; 0.95cm^2 area and 0.095cm^3 volume. There is no tunneling or undermining noted. There is a medium amount of serosanguineous drainage noted. There is no granulation within the wound bed. There is a large (67-100%) amount of necrotic tissue within the wound bed including Adherent Slough. Wound #7 status is Open. Original cause of wound was Gradually Appeared. The wound is located on the Left,Lateral Foot. The wound measures 2.5cm length x 2.5cm width x 0.2cm depth; 4.909cm^2 area and 0.982cm^3 volume. There is no tunneling or undermining noted. There is a medium amount of serosanguineous drainage noted. The wound margin is fibrotic, thickened scar. There is a large (67-100%) amount of necrotic tissue within the  wound bed including Eschar and Adherent Slough. Wound #8 status is Open. Original cause of wound was Gradually Appeared. The wound is located on the Left Calcaneus. The wound measures 0.9cm length  x 0.6cm width x 0.1cm depth; 0.424cm^2 area and 0.042cm^3 volume. There is no tunneling or undermining noted. There is a medium amount of drainage noted. There is no granulation within the wound bed. There is a large (67-100%) amount of necrotic tissue within the wound bed including Adherent Slough. Wound #9 status is Open. Original cause of wound was Gradually Appeared. The wound is located on the Right T Third. The wound measures 0.3cm length x oe 0.3cm width x 0.1cm depth; 0.071cm^2 area and 0.007cm^3 volume. There is no tunneling or undermining noted. There is a medium amount of serosanguineous drainage noted. The wound margin is flat and intact. There is no granulation within the wound bed. There is a large (67-100%) amount of necrotic tissue within the wound bed including Adherent Slough. 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 There was a Chemical/Enzymatic/Mechanical debridement performed by Maxwell Caul., MD.. Agent used was Santyl. There was no bleeding. The procedure was tolerated well. Post Debridement Measurements: 0.3cm length x 0.3cm width x 0.1cm depth; 0.007cm^3 volume. Character of Wound/Ulcer Post Debridement requires further debridement. Severity of Tissue Post Debridement is: Fat layer exposed. Post procedure Diagnosis Wound #1: Same as Pre-Procedure Wound #10 Pre-procedure diagnosis of Wound #10 is a Diabetic Wound/Ulcer of the Lower  Extremity located on the Right T Fourth .Severity of Tissue Pre Debridement oe is: Fat layer exposed. There was a Chemical/Enzymatic/Mechanical debridement performed by Maxwell Caul., MD.. Agent used was Santyl. There was no bleeding. The procedure was tolerated well. Post Debridement Measurements: 5cm length x 0.5cm width x 0.1cm depth; 0.196cm^3 volume. Character of Wound/Ulcer Post Debridement requires further debridement. Severity of Tissue Post Debridement is: Fat layer exposed. Post procedure Diagnosis Wound #10: Same as Pre-Procedure Wound #11 Pre-procedure diagnosis of Wound #11 is a Diabetic Wound/Ulcer of the Lower Extremity located on the Right T Fifth .Severity of Tissue Pre Debridement oe is: Fat layer exposed. There was a Chemical/Enzymatic/Mechanical debridement performed by Maxwell Caul., MD.. Agent used was Santyl. There was no bleeding. The procedure was tolerated well. Post Debridement Measurements: 0.4cm length x 0.4cm width x 0.1cm depth; 0.013cm^3 volume. Character of Wound/Ulcer Post Debridement requires further debridement. Severity of Tissue Post Debridement is: Fat layer exposed. Post procedure Diagnosis Wound #11: Same as Pre-Procedure Wound #12 Pre-procedure diagnosis of Wound #12 is a Diabetic Wound/Ulcer of the Lower Extremity located on the Right Calcaneus .Severity of Tissue Pre Debridement is: Fat layer exposed. There was a Chemical/Enzymatic/Mechanical debridement performed by Maxwell Caul., MD.. Agent used was Santyl. There was no bleeding. The procedure was tolerated well. Post Debridement Measurements: 2.1cm length x 1cm width x 0.1cm depth; 0.165cm^3 volume. Character of Wound/Ulcer Post Debridement requires further debridement. Severity of Tissue Post Debridement is: Fat layer exposed. Post procedure Diagnosis Wound #12: Same as Pre-Procedure Wound #2 Pre-procedure diagnosis of Wound #  2 is a Diabetic Wound/Ulcer of the Lower Extremity located  on the Right,Posterior T [place] .Severity of Tissue Pre oe Debridement is: Fat layer exposed. There was a Chemical/Enzymatic/Mechanical debridement performed by Maxwell Caul., MD.. Agent used was Santyl. There was no bleeding. The procedure was tolerated well. Post Debridement Measurements: 0.5cm length x 0.6cm width x 0.1cm depth; 0.024cm^3 volume. Character of Wound/Ulcer Post Debridement requires further debridement. Severity of Tissue Post Debridement is: Fat layer exposed. Post procedure Diagnosis Wound #2: Same as Pre-Procedure Wound #3 Pre-procedure diagnosis of Wound #3 is a Diabetic Wound/Ulcer of the Lower Extremity located on the Left T Second .Severity of Tissue Pre Debridement oe is: Fat layer exposed. There was a Chemical/Enzymatic/Mechanical debridement performed by Maxwell Caul., MD.. Agent used was Santyl. There was no bleeding. The procedure was tolerated well. Post Debridement Measurements: 0.3cm length x 0.3cm width x 0.1cm depth; 0.007cm^3 volume. Character of Wound/Ulcer Post Debridement requires further debridement. Severity of Tissue Post Debridement is: Fat layer exposed. Post procedure Diagnosis Wound #3: Same as Pre-Procedure Wound #4 Pre-procedure diagnosis of Wound #4 is a Diabetic Wound/Ulcer of the Lower Extremity located on the Left T Third .Severity of Tissue Pre Debridement is: oe Fat layer exposed. There was a Chemical/Enzymatic/Mechanical debridement performed by Maxwell Caul., MD.. Agent used was Santyl. There was no bleeding. The procedure was tolerated well. Post Debridement Measurements: 0.2cm length x 0.2cm width x 0.1cm depth; 0.003cm^3 volume. Character of Wound/Ulcer Post Debridement requires further debridement. Severity of Tissue Post Debridement is: Fat layer exposed. Post procedure Diagnosis Wound #4: Same as Pre-Procedure Wound #5 Pre-procedure diagnosis of Wound #5 is a Diabetic Wound/Ulcer of the Lower Extremity located on the Left  T Fourth .Severity of Tissue Pre Debridement is: oe Fat layer exposed. There was a Chemical/Enzymatic/Mechanical debridement performed by Maxwell Caul., MD.. Agent used was Santyl. There was no bleeding. The procedure was tolerated well. Post Debridement Measurements: 1.5cm length x 2.1cm width x 0.1cm depth; 0.247cm^3 volume. Character of Wound/Ulcer Post Debridement requires further debridement. Severity of Tissue Post Debridement is: Fat layer exposed. Post procedure Diagnosis Wound #5: Same as Pre-Procedure Wound #6 Pre-procedure diagnosis of Wound #6 is a Diabetic Wound/Ulcer of the Lower Extremity located on the Left,Medial Foot .Severity of Tissue Pre Debridement is: Fat layer exposed. There was a Chemical/Enzymatic/Mechanical debridement performed by Maxwell Caul., MD.. Agent used was Santyl. There was no bleeding. The procedure was tolerated well. Post Debridement Measurements: 1.1cm length x 1.1cm width x 0.1cm depth; 0.095cm^3 volume. Character of Wound/Ulcer Post Debridement requires further debridement. Severity of Tissue Post Debridement is: Fat layer exposed. Post procedure Diagnosis Wound #6: Same as Pre-Procedure Wound #7 Pre-procedure diagnosis of Wound #7 is a Diabetic Wound/Ulcer of the Lower Extremity located on the Left,Lateral Foot .Severity of Tissue Pre Debridement is: Fat layer exposed. There was a Chemical/Enzymatic/Mechanical debridement performed by Maxwell Caul., MD.. Agent used was Santyl. There was no bleeding. The procedure was tolerated well. Post Debridement Measurements: 2.5cm length x 2.5cm width x 0.2cm depth; 0.982cm^3 volume. Character of Wound/Ulcer Post Debridement requires further debridement. Severity of Tissue Post Debridement is: Fat layer exposed. Post procedure Diagnosis Wound #7: Same as Pre-Procedure Plan Follow-up Appointments: Return appointment in 3 weeks. Dressing Change Frequency: Wound #1 Left,Anterior T Great: oe Change  dressing every day. Wound #10 Right T Fourth: oe Change dressing every day. Wound #11 Right T Fifth: oe Change dressing every day. Wound #12 Right Calcaneus: Change dressing every day.  Wound #2 Right,Posterior T Great: oe Change dressing every day. Wound #3 Left T Second: oe Change dressing every day. Wound #4 Left T Third: oe Change dressing every day. Wound #5 Left T Fourth: oe Change dressing every day. Wound #6 Left,Medial Foot: Change dressing every day. Wound #7 Left,Lateral Foot: Change dressing every day. Wound #8 Left Calcaneus: Change dressing every day. Wound #9 Right T Third: oe Change dressing every day. Wound Cleansing: Clean wound with Wound Cleanser Primary Wound Dressing: Wound #1 Left,Anterior T Great: oe Santyl Ointment Wound #10 Right T Fourth: oe Santyl Ointment Wound #11 Right T Fifth: oe Santyl Ointment Wound #12 Right Calcaneus: Santyl Ointment Wound #2 Right,Posterior T Great: oe Santyl Ointment Wound #3 Left T Second: oe Santyl Ointment Wound #4 Left T Third: oe Santyl Ointment Wound #5 Left T Fourth: oe Santyl Ointment Wound #6 Left,Medial Foot: Santyl Ointment Wound #7 Left,Lateral Foot: Santyl Ointment Wound #8 Left Calcaneus: Santyl Ointment Wound #9 Right T Third: oe Santyl Ointment Secondary Dressing: Wound #1 Left,Anterior T Great: oe Kerlix/Rolled Gauze Dry Gauze - separate toes with 4x4 as well Heel Cup Wound #10 Right T Fourth: oe Kerlix/Rolled Gauze Dry Gauze - separate toes with 4x4 as well Heel Cup Wound #11 Right T Fifth: oe Kerlix/Rolled Gauze Dry Gauze - separate toes with 4x4 as well Heel Cup Wound #12 Right Calcaneus: Kerlix/Rolled Gauze Dry Gauze - separate toes with 4x4 as well Heel Cup Wound #2 Right,Posterior T Great: oe Kerlix/Rolled Gauze Dry Gauze - separate toes with 4x4 as well Heel Cup Wound #3 Left T Second: oe Kerlix/Rolled Gauze Dry Gauze - separate toes with 4x4 as  well Heel Cup Wound #4 Left T Third: oe Kerlix/Rolled Gauze Dry Gauze - separate toes with 4x4 as well Heel Cup Wound #5 Left T Fourth: oe Kerlix/Rolled Gauze Dry Gauze - separate toes with 4x4 as well Heel Cup Wound #6 Left,Medial Foot: Kerlix/Rolled Gauze Dry Gauze - separate toes with 4x4 as well Heel Cup Wound #7 Left,Lateral Foot: Kerlix/Rolled Gauze Dry Gauze - separate toes with 4x4 as well Heel Cup Wound #8 Left Calcaneus: Kerlix/Rolled Gauze Dry Gauze - separate toes with 4x4 as well Heel Cup Wound #9 Right T Third: oe Kerlix/Rolled Gauze Dry Gauze - separate toes with 4x4 as well Heel Cup 1. The facility was already using Santyl on these wounds I think that is appropriate. 2. I do not think any of the wounds on her toes are going to heal. There may be some hope for the areas on the lateral heels provided pressure relief is absolute. 3. 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 No debridement is indicated on any of these areas. Furthermore going forward I doubt I would debride any of this unless the wounds are showing some signs of improvement 5. Her small toes bilaterally are pushed together. I have asked the facility to make sure they are separated otherwise we are at risk for breakdown in between the toes and some of these areas. 6. Her family was not present to discuss any of the options here which are really few. I do not know that there is an indication for amputation bilaterally currently since she is not in a lot of pain and these wounds are not infected or grossly necrosis. HOWEVER it is likely at some point that this will progress and then their options will be as  Dr. Durwin Nora pointed out amputations versus palliative care I spent 45 minutes in review of this patient's records, face-to-face evaluation and preparation of this note. Electronic  Signature(s) Signed: 04/08/2020 5:20:32 PM By: Baltazar Najjar MD Entered By: Baltazar Najjar on 04/08/2020 13:06:42 -------------------------------------------------------------------------------- HxROS Details Patient Name: Date of Service: LO MBA RDI, [name] 04/08/2020 10:30 A M Medical Record Number: [id number] Patient Account Number: [id number] Date of Birth/Sex: Treating RN: [birth year] (74 y.o. Freddy Finner Primary Care Provider: Grafton Folk CO BS Other Clinician: Referring Provider: Treating Provider/Extender: Sharlette Dense, JA CO BS Weeks in Treatment: 0 Constitutional Symptoms (General Health) Complaints and Symptoms: Negative for: Fatigue; Fever; Chills; Marked Weight Change Eyes Complaints and Symptoms: Negative for: Dry Eyes; Vision Changes; Glasses / Contacts Medical History: Negative for: Cataracts; Glaucoma; Optic Neuritis Ear/Nose/Mouth/Throat Complaints and Symptoms: Negative for: Chronic sinus problems or rhinitis Medical History: Negative for: Chronic sinus problems/congestion; Middle ear problems Respiratory Complaints and Symptoms: Negative for: Chronic or frequent coughs; Shortness of Breath Medical History: Positive for: Chronic Obstructive Pulmonary Disease (COPD) Negative for: Aspiration; Asthma; Pneumothorax; Sleep Apnea; Tuberculosis Cardiovascular Complaints and Symptoms: Negative for: Chest pain Medical History: Positive for: Congestive Heart Failure; Hypertension; Peripheral Venous Disease Negative for: Angina; Arrhythmia; Coronary Artery Disease; Deep Vein Thrombosis; Hypotension; Myocardial Infarction; Peripheral Arterial Disease; Phlebitis; Vasculitis Gastrointestinal Complaints and Symptoms: Negative for: Frequent diarrhea; Nausea; Vomiting Medical History: Negative for: Cirrhosis ; Colitis; Crohns; Hepatitis A; Hepatitis B; Hepatitis C Endocrine Complaints and Symptoms: Negative for: Heat/cold intolerance Medical  History: Positive for: Type II Diabetes Negative for: Type I Diabetes Treated with: Insulin Blood sugar tested every day: No Genitourinary Complaints and Symptoms: Negative for: Frequent urination Medical History: Negative for: End Stage Renal Disease Integumentary (Skin) Complaints and Symptoms: Positive for: Wounds Medical History: Negative for: History of Burn Musculoskeletal Complaints and Symptoms: Negative for: Muscle Pain; Muscle Weakness Medical History: Negative for: Gout; Rheumatoid Arthritis; Osteoarthritis; Osteomyelitis Neurologic Complaints and Symptoms: Negative for: Numbness/parasthesias Medical History: Negative for: Dementia; Neuropathy; Quadriplegia; Paraplegia; Seizure Disorder Psychiatric Complaints and Symptoms: Negative for: Claustrophobia; Suicidal Medical History: Negative for: Anorexia/bulimia; Confinement Anxiety Hematologic/Lymphatic Medical History: Negative for: Anemia; Hemophilia; Human Immunodeficiency Virus; Lymphedema; Sickle Cell Disease Immunological Medical History: Negative for: Lupus Erythematosus; Raynauds; Scleroderma Oncologic Medical History: Negative for: Received Chemotherapy; Received Radiation Immunizations Pneumococcal Vaccine: Received Pneumococcal Vaccination: No Implantable Devices None Family and Social History Unknown History: Yes; Former smoker; Alcohol Use: Never; Drug Use: No History; Caffeine Use: Rarely; Financial Concerns: No; Food, Clothing or Shelter Needs: No; Support System Lacking: No; Transportation Concerns: No Psychologist, prison and probation services) Signed: 04/08/2020 5:20:32 PM By: Baltazar Najjar MD Signed: 04/08/2020 5:42:58 PM By: Yevonne Pax RN Entered By: Yevonne Pax on 04/08/2020 11:57:23 -------------------------------------------------------------------------------- SuperBill Details Patient Name: Date of Service: LO MBA RDI, [name] 04/08/2020 Medical Record Number: [id number] Patient Account Number:  [id number] Date of Birth/Sex: Treating RN: [birth year] (74 y.o. 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

## 2020-04-29 ENCOUNTER — Encounter (HOSPITAL_BASED_OUTPATIENT_CLINIC_OR_DEPARTMENT_OTHER): Payer: Medicare Other | Attending: Internal Medicine | Admitting: Internal Medicine

## 2020-05-09 ENCOUNTER — Encounter (HOSPITAL_BASED_OUTPATIENT_CLINIC_OR_DEPARTMENT_OTHER): Payer: Medicare Other | Attending: Internal Medicine | Admitting: Internal Medicine

## 2020-05-09 DIAGNOSIS — E11621 Type 2 diabetes mellitus with foot ulcer: Secondary | ICD-10-CM | POA: Diagnosis present

## 2020-05-09 DIAGNOSIS — E1151 Type 2 diabetes mellitus with diabetic peripheral angiopathy without gangrene: Secondary | ICD-10-CM | POA: Diagnosis not present

## 2020-05-09 DIAGNOSIS — E1122 Type 2 diabetes mellitus with diabetic chronic kidney disease: Secondary | ICD-10-CM | POA: Diagnosis not present

## 2020-05-09 DIAGNOSIS — I130 Hypertensive heart and chronic kidney disease with heart failure and stage 1 through stage 4 chronic kidney disease, or unspecified chronic kidney disease: Secondary | ICD-10-CM | POA: Insufficient documentation

## 2020-05-09 DIAGNOSIS — I480 Paroxysmal atrial fibrillation: Secondary | ICD-10-CM | POA: Diagnosis not present

## 2020-05-09 DIAGNOSIS — L97518 Non-pressure chronic ulcer of other part of right foot with other specified severity: Secondary | ICD-10-CM | POA: Diagnosis not present

## 2020-05-09 DIAGNOSIS — F0150 Vascular dementia without behavioral disturbance: Secondary | ICD-10-CM | POA: Diagnosis not present

## 2020-05-09 DIAGNOSIS — J449 Chronic obstructive pulmonary disease, unspecified: Secondary | ICD-10-CM | POA: Insufficient documentation

## 2020-05-09 DIAGNOSIS — Z7901 Long term (current) use of anticoagulants: Secondary | ICD-10-CM | POA: Diagnosis not present

## 2020-05-09 DIAGNOSIS — N183 Chronic kidney disease, stage 3 unspecified: Secondary | ICD-10-CM | POA: Insufficient documentation

## 2020-05-09 DIAGNOSIS — I509 Heart failure, unspecified: Secondary | ICD-10-CM | POA: Diagnosis not present

## 2020-05-09 DIAGNOSIS — I2510 Atherosclerotic heart disease of native coronary artery without angina pectoris: Secondary | ICD-10-CM | POA: Insufficient documentation

## 2020-05-09 DIAGNOSIS — Z933 Colostomy status: Secondary | ICD-10-CM | POA: Diagnosis not present

## 2020-05-09 DIAGNOSIS — Z8673 Personal history of transient ischemic attack (TIA), and cerebral infarction without residual deficits: Secondary | ICD-10-CM | POA: Insufficient documentation

## 2020-05-10 NOTE — Progress Notes (Signed)
[name], [name] ([id number]) Visit Report for 05/09/2020 HPI Details Patient Name: Date of Service: LO MBA RDI, Russell 05/09/2020 9:45 A M Medical Record Number: [id number] Patient Account Number: [id number] Date of Birth/Sex: Treating RN: [birth year] (74 y.o. 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 She is a resident of [facility] apparently since April 2021. We have contacted the facility apparently she arrived in the facility in April with wounds pretty much similar to what she has currently. Beyond this the history is really unknown. Appropriately they eventually referred this patient to Dr. Durwin Nora who saw her on 02/24/2020 feeling she had critical bilateral limb ischemia. On 03/25/2020 she underwent an angiogram by Dr. Durwin Nora. On the right side she had a small deep femoral artery. Diffuse disease of the superficial femoral which was occluded in the mid thigh there was reconstitution of the disease popliteal artery above the knee. The below-knee popliteal artery was patent and there was three-vessel runoff via small arteries. On the left side the common femoral artery was occluded in the left. There was reconstitution of a small deep femoral artery there was reconstitution of the superficial femoral artery distally which already had moderate to severe disease. The above-knee popliteal artery was diseased the below-knee popliteal artery was patent and once again there was three-vessel runoff on the left which were all small vessels. Dr. Durwin Nora was quite clear in his note that she was not a candidate for bypass and was not a candidate for an endovascular approach. He felt that the if the wounds progressed where  she became systemically unwell bilateral amputations would be necessary versus palliative care The patient comes in today with multiple wounds on her bilateral toes and both heels laterally. Past medical history includes a colostomy and a Foley catheter. I am not sure what the issue is here. Apparently vascular dementia, coronary artery disease, paroxysmal A. fib on Eliquis, stage III chronic renal failure, type 2 diabetes, history of TIA and PAD. ABI in our clinic on the right was 0.4 on the left it was not obtainable. 8/23; this is a patient with ischemic wounds on her bilateral feet. He has previously had an angiogram by Dr. Durwin Nora. Afterwards he made the comment that she was not a candidate for bypass or an endovascular approach to revascularization. I think she is deteriorated somewhat since we saw her a month ago. When I saw her initially I made the comment that I thought we needed to have an ethical discussion with her POA who I guess is her daughter. The question would be as she ends up developing more symptoms with they ever agreed to an amputation probably bilaterally versus palliative/hospice care with pain control. The patient has many wounds on her bilateral feet worse on the left. We have been using Santyl although at this point I am close to changing this to a palliative dressing like silver alginate. Electronic Signature(s) Signed: 05/09/2020 4:33:08 PM By: Baltazar Najjar MD Entered By: Baltazar Najjar on 05/09/2020 11:19:44 -------------------------------------------------------------------------------- Physical Exam Details Patient Name: Date of Service: LO MBA RDI, [name] 05/09/2020 9:45 A M Medical Record Number: [id number] Patient Account Number: [id number] Date of Birth/Sex: Treating RN: 04/13/1946 (74 y.o. Wynelle Link Primary Care Provider: Grafton Folk CO BS Other Clinician: Referring Provider:  Treating Provider/Extender: Sharlette Dense, JA CO BS Weeks in  Treatment: 4 Constitutional Sitting or standing Blood Pressure is within target range for patient.. Pulse regular and within target range for patient.Marland Kitchen Respirations regular, non-labored and within target range.. Temperature is normal and within the target range for the patient.. Uncomfortable with any manipulation of her feet. Cardiovascular I cannot feel a femoral or a popliteal pulse either.. Pedal pulses absent bilaterally.Marland Kitchen Psychiatric Advanced dementia very restless.. Notes Wound exam Small punched out areas on the dorsal aspect of her first second and third toes. Necrotic wound on the medial part of the left 4th toe in the webspace between the fourth and fifth. She has a plantar wound as well as a left lateral heel wound. She had a large amount of denuded skin that I removed on the dorsal part of the left foot. On the right she also has wounds on the dorsal toe and the lateral calcaneus. Electronic Signature(s) Signed: 05/09/2020 4:33:08 PM By: Baltazar Najjar MD Entered By: Baltazar Najjar on 05/09/2020 11:21:54 -------------------------------------------------------------------------------- Physician Orders Details Patient Name: Date of Service: LO MBA RDI, [name] 05/09/2020 9:45 A M Medical Record Number: [id number] Patient Account Number: [id number] Date of Birth/Sex: Treating RN: 10/18/1945 (74 y.o. 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 - ***75 minutes (13 wounds)*** Dressing Change Frequency Change dressing every day. - all wounds Wound Cleansing Clean wound with Wound Cleanser Primary Wound Dressing Wound #1 Left,Anterior T Great oe Santyl Ointment Wound #10 Right T Fourth oe Santyl Ointment Wound #11 Right T Fifth oe Santyl Ointment Wound #12 Right Calcaneus Santyl Ointment Wound #3 Left T Second oe Santyl Ointment Wound #4 Left T Third oe Santyl Ointment Wound #5 Left T Fourth oe Santyl Ointment Wound #6 Left,Medial Foot Santyl Ointment Wound #7 Left,Lateral Foot Santyl Ointment Wound #8 Left Calcaneus Santyl Ointment Wound #13 Left,Plantar Foot Santyl Ointment Wound #9 Right T Third oe Santyl Ointment Secondary Dressing Wound #12 Right Calcaneus Kerlix/Rolled Gauze - all wounds Dry Gauze - all wounds - separate toes with 4x4 as well Heel Cup - on both heels Electronic Signature(s) Signed: 05/09/2020 4:21:43 PM By: Zandra Abts RN, BSN Signed: 05/09/2020 4:33:08 PM By: Baltazar Najjar MD Entered By: Zandra Abts on 05/09/2020 11:02:56 -------------------------------------------------------------------------------- Problem List Details Patient Name: Date of Service: LO MBA RDI, [name] 05/09/2020 9:45 A M Medical Record Number: [id number] Patient Account Number: [id number] Date of Birth/Sex: Treating RN: 02-Jun-1946 (74 y.o. Wynelle Link Primary Care Provider: Grafton Folk CO BS Other Clinician: Referring Provider: Treating Provider/Extender: Sharlette Dense, JA CO BS Weeks in Treatment: 4 Active Problems ICD-10 Encounter Code Description Active Date MDM Diagnosis E11.621 Type 2 diabetes mellitus with foot ulcer 04/08/2020 No Yes E11.51 Type 2 diabetes mellitus with diabetic peripheral angiopathy without gangrene 04/08/2020 No Yes E11.42 Type 2 diabetes mellitus  with diabetic polyneuropathy 04/08/2020 No Yes L97.528 Non-pressure chronic ulcer of other part of left foot with other specified 04/08/2020 No Yes  severity L97.518 Non-pressure chronic ulcer of other part of right foot with other specified 04/08/2020 No Yes severity Inactive Problems Resolved Problems Electronic Signature(s) Signed: 05/09/2020 4:33:08 PM By: Baltazar Najjar MD Entered By: Baltazar Najjar on 05/09/2020 11:17:09 -------------------------------------------------------------------------------- Progress Note Details Patient Name: Date of Service: LO MBA RDI, [name] 05/09/2020 9:45 A M Medical Record Number: [id number] Patient Account Number: [id number] Date of Birth/Sex: Treating RN: Sep 18, 1945 (74 y.o. 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 She is a resident of [facility] apparently since April 2021. We have contacted the facility apparently she arrived in the facility in April with wounds pretty much similar to what she has currently. Beyond this the history is really unknown. Appropriately they eventually referred this patient to Dr. Durwin Nora who saw her on 02/24/2020 feeling she had critical bilateral limb ischemia. On 03/25/2020 she underwent an angiogram by Dr. Durwin Nora. On the right side she had a small deep femoral artery. Diffuse disease of the superficial femoral which was occluded in the mid thigh there was reconstitution of the disease popliteal artery above the knee. The below-knee popliteal artery was patent and there was three-vessel runoff via small arteries. On the left side the common femoral artery was occluded in the left. There was reconstitution of a small deep femoral artery  there was reconstitution of the superficial femoral artery distally which already had moderate to severe disease. The above-knee popliteal artery was diseased the below-knee popliteal artery was patent and once again there was three-vessel runoff on the left which were all small vessels. Dr. Durwin Nora was quite clear in his note that she was not a candidate for bypass and was not a candidate for an endovascular approach. He felt that the if the wounds progressed where she became systemically unwell bilateral amputations would be necessary versus palliative care The patient comes in today with multiple wounds on her bilateral toes and both heels laterally. Past medical history includes a colostomy and a Foley catheter. I am not sure what the issue is here. Apparently vascular dementia, coronary artery disease, paroxysmal A. fib on Eliquis, stage III chronic renal failure, type 2 diabetes, history of TIA and PAD. ABI in our clinic on the right was 0.4 on the left it was not obtainable. 8/23; this is a patient with ischemic wounds on her bilateral feet. He has previously had an angiogram by Dr. Durwin Nora. Afterwards he made the comment that she was not a candidate for bypass or an endovascular approach to revascularization. I think she is deteriorated somewhat since we saw her a month ago. When I saw her initially I made the comment that I thought we needed to have an ethical discussion with her POA who I guess is her daughter. The question would be as she ends up developing more symptoms with they ever agreed to an amputation probably bilaterally versus palliative/hospice care with pain control. The patient has many wounds on her bilateral feet worse on the left. We have been using Santyl although at this point I am close to changing this to a palliative dressing like silver alginate. Objective Constitutional Sitting or standing Blood Pressure is within target range for patient.. Pulse regular and within  target range for patient.Marland Kitchen Respirations regular, non-labored and within target range.. Temperature  is normal and within the target range for the patient.. Uncomfortable with any manipulation of her feet. Vitals Time Taken: 10:15 AM, Height: 62 in, Weight: 158 lbs, BMI: 28.9, Temperature: 98.5 F, Pulse: 99 bpm, Respiratory Rate: 19 breaths/min, Blood Pressure: 142/65 mmHg. Cardiovascular I cannot feel a femoral or a popliteal pulse either.. Pedal pulses absent bilaterally.Marland Kitchen. Psychiatric Advanced dementia very restless.. General Notes: Wound exam ooSmall punched out areas on the dorsal aspect of her first second and third toes. Necrotic wound on the medial part of the left 4th toe in the webspace between the fourth and fifth. She has a plantar wound as well as a left lateral heel wound. She had a large amount of denuded skin that I removed on the dorsal part of the left foot. ooOn the right she also has wounds on the dorsal toe and the lateral calcaneus. Integumentary (Hair, Skin) Wound #1 status is Open. Original cause of wound was Gradually Appeared. The wound is located on the Left,Anterior T [place]. The wound measures 0.4cm oe length x 0.4cm width x 0.1cm depth; 0.126cm^2 area and 0.013cm^3 volume. There is no tunneling or undermining noted. There is a medium amount of serous drainage noted. The wound margin is distinct with the outline attached to the wound base. There is no granulation within the wound bed. There is a large (67- 100%) amount of necrotic tissue within the wound bed including Adherent Slough. Wound #10 status is Open. Original cause of wound was Gradually Appeared. The wound is located on the Right T Fourth. The wound measures 0.6cm length oe x 0.6cm width x 0.1cm depth; 0.283cm^2 area and 0.028cm^3 volume. There is Fat Layer (Subcutaneous Tissue) exposed. There is no tunneling or undermining noted. There is a medium amount of serous drainage noted. The wound margin is  distinct with the outline attached to the wound base. There is medium (34- 66%) pink granulation within the wound bed. There is a medium (34-66%) amount of necrotic tissue within the wound bed including Adherent Slough. Wound #11 status is Open. Original cause of wound was Gradually Appeared. The wound is located on the Right T Fifth. The wound measures 0.4cm length x oe 0.4cm width x 0.1cm depth; 0.126cm^2 area and 0.013cm^3 volume. There is Fat Layer (Subcutaneous Tissue) exposed. There is no tunneling or undermining noted. There is a medium amount of serous drainage noted. The wound margin is distinct with the outline attached to the wound base. There is medium (34- 66%) pink granulation within the wound bed. There is a medium (34-66%) amount of necrotic tissue within the wound bed including Adherent Slough. Wound #12 status is Open. Original cause of wound was Gradually Appeared. The wound is located on the Right Calcaneus. The wound measures 1.5cm length x 0.7cm width x 0.1cm depth; 0.825cm^2 area and 0.082cm^3 volume. There is Fat Layer (Subcutaneous Tissue) exposed. There is no tunneling or undermining noted. There is a medium amount of serous drainage noted. The wound margin is flat and intact. There is medium (34-66%) pink granulation within the wound bed. There is a medium (34-66%) amount of necrotic tissue within the wound bed including Adherent Slough. Wound #13 status is Open. Original cause of wound was Gradually Appeared. The wound is located on the Left,Plantar Foot. The wound measures 1.1cm length x 1cm width x 0.1cm depth; 0.864cm^2 area and 0.086cm^3 volume. There is no tunneling or undermining noted. There is a small amount of serous drainage noted. Foul odor after cleansing was noted. The wound margin  is distinct with the outline attached to the wound base. There is no granulation within the wound bed. There is a large (67-100%) amount of necrotic tissue within the wound bed  including Eschar and Adherent Slough. Wound #2 status is Healed - Epithelialized. Original cause of wound was Gradually Appeared. The wound is located on the Right,Posterior T [place]. The wound oe measures 0cm length x 0cm width x 0cm depth; 0cm^2 area and 0cm^3 volume. There is no tunneling or undermining noted. There is a none present amount of drainage noted. The wound margin is distinct with the outline attached to the wound base. There is no granulation within the wound bed. There is no necrotic tissue within the wound bed. Wound #3 status is Open. Original cause of wound was Gradually Appeared. The wound is located on the Left T Second. The wound measures 0.2cm length x oe 0.2cm width x 0.1cm depth; 0.031cm^2 area and 0.003cm^3 volume. There is no tunneling or undermining noted. There is a medium amount of serous drainage noted. The wound margin is distinct with the outline attached to the wound base. There is no granulation within the wound bed. There is a large (67-100%) amount of necrotic tissue within the wound bed including Adherent Slough. Wound #4 status is Open. Original cause of wound was Gradually Appeared. The wound is located on the Left T Third. The wound measures 0.2cm length x oe 0.4cm width x 0.1cm depth; 0.063cm^2 area and 0.006cm^3 volume. There is no tunneling or undermining noted. There is a medium amount of serous drainage noted. The wound margin is flat and intact. There is no granulation within the wound bed. There is a large (67-100%) amount of necrotic tissue within the wound bed including Adherent Slough. Wound #5 status is Open. Original cause of wound was Gradually Appeared. The wound is located on the Left T Fourth. The wound measures 2.4cm length x oe 2.3cm width x 0.1cm depth; 4.335cm^2 area and 0.434cm^3 volume. There is no tunneling or undermining noted. There is a medium amount of purulent drainage noted. Foul odor after cleansing was noted. The wound margin  is distinct with the outline attached to the wound base. There is no granulation within the wound bed. There is a large (67-100%) amount of necrotic tissue within the wound bed including Eschar and Adherent Slough. Wound #6 status is Open. Original cause of wound was Gradually Appeared. The wound is located on the Left,Medial Foot. The wound measures 0.7cm length x 0.8cm width x 0.1cm depth; 0.44cm^2 area and 0.044cm^3 volume. There is no tunneling or undermining noted. There is a medium amount of serous drainage noted. Foul odor after cleansing was noted. The wound margin is distinct with the outline attached to the wound base. There is no granulation within the wound bed. There is a large (67-100%) amount of necrotic tissue within the wound bed including Adherent Slough. Wound #7 status is Open. Original cause of wound was Gradually Appeared. The wound is located on the Left,Lateral Foot. The wound measures 2cm length x 1.5cm width x 0.2cm depth; 2.356cm^2 area and 0.471cm^3 volume. There is no tunneling or undermining noted. There is a medium amount of purulent drainage noted. Foul odor after cleansing was noted. The wound margin is well defined and not attached to the wound base. There is a large (67-100%) amount of necrotic tissue within the wound bed including Eschar and Adherent Slough. Wound #8 status is Open. Original cause of wound was Gradually Appeared. The wound is located on  the Left Calcaneus. The wound measures 0.2cm length x 0.2cm width x 0.1cm depth; 0.031cm^2 area and 0.003cm^3 volume. There is no tunneling or undermining noted. There is a medium amount of serous drainage noted. The wound margin is distinct with the outline attached to the wound base. There is no granulation within the wound bed. There is a large (67-100%) amount of necrotic tissue within the wound bed including Adherent Slough. Wound #9 status is Open. Original cause of wound was Gradually Appeared. The wound is  located on the Right T Third. The wound measures 0.3cm length x oe 0.3cm width x 0.1cm depth; 0.071cm^2 area and 0.007cm^3 volume. There is no tunneling or undermining noted. There is a medium amount of serosanguineous drainage noted. The wound margin is flat and intact. There is no granulation within the wound bed. There is a large (67-100%) amount of necrotic tissue within the wound bed including Adherent Slough. 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 - ***75 minutes (13 wounds)*** Dressing Change Frequency: Change dressing every day. - all wounds Wound Cleansing: Clean wound with Wound Cleanser Primary Wound Dressing: Wound #1 Left,Anterior T Great: oe Santyl Ointment Wound #10 Right T Fourth: oe Santyl Ointment Wound #11 Right T Fifth: oe Santyl Ointment Wound #12 Right Calcaneus: Santyl Ointment Wound #3 Left T Second: oe Santyl Ointment Wound #4 Left T Third: oe Santyl Ointment Wound #5 Left T Fourth: oe Santyl Ointment Wound #6 Left,Medial Foot: Santyl Ointment Wound #7 Left,Lateral Foot: Santyl Ointment Wound #8 Left Calcaneus: Santyl Ointment Wound #13 Left,Plantar Foot: Santyl Ointment Wound #9 Right T Third: oe Santyl Ointment Secondary Dressing: Wound #12 Right Calcaneus: Kerlix/Rolled Gauze - all wounds Dry Gauze - all wounds - separate toes with 4x4 as well Heel Cup - on both heels 1. I continued with Santyl ointment although I continued to think about using silver alginate as a palliative dressing 2. Dr. Durwin Nora made the comment that this patient ultimately  would require amputations perhaps bilaterally versus palliative/hospice care. She certainly seems to be in a lot more pain this this time than a month ago when. Left foot especially has deteriorated somewhat. 3. Someone needs have a discussion with the patient's POA about what this is going to look like going forward. It is very difficult to see this woman undergoing bilateral likely above-knee amputations. 4. I do not relish the idea of having complicated ethical discussions with family's that I do not really know. I put a note in my paperwork to go back to the facility that suggested that somebody needs to sit down and talk to them. She is not currently at a point where she needs to be considered for amputation although I do not think were too far off i.e. 6-8 perhaps as long as 12 weeks Electronic Signature(s) Signed: 05/09/2020 4:33:08 PM By: Baltazar Najjar MD Entered By: Baltazar Najjar on 05/09/2020 11:24:26 -------------------------------------------------------------------------------- SuperBill Details Patient Name: Date of Service: LO MBA RDI, [name] 05/09/2020 Medical Record Number: [id number] Patient Account Number: [id number] Date of Birth/Sex: Treating RN: 1945/10/23 (73 y.o. 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 L97.518 Non-pressure chronic ulcer of other part of right foot with other specified severity Facility Procedures CPT4 Code: [id number] Description: 87564 - WOUND CARE VISIT-LEV 5 EST  PT Modifier: Quantity: 1 Physician Procedures : CPT4 Code Description Modifier [id number] - WC PHYS LEVEL 3 - EST PT ICD-10 Diagnosis Description L97.528 Non-pressure chronic ulcer of other part of left foot with other specified severity L97.518 Non-pressure chronic ulcer of other part of right  foot with other specified severity E11.621 Type 2 diabetes mellitus with foot ulcer Quantity: 1 Electronic Signature(s) Signed: 05/09/2020 4:21:43 PM By: Zandra Abts RN, BSN Signed: 05/09/2020 4:33:08 PM By: Baltazar Najjar MD Entered By: Zandra Abts on 05/09/2020 11:59:32

## 2020-05-10 NOTE — Progress Notes (Signed)
[name] ([id number]) Visit Report for 05/09/2020 Arrival Information Details Patient Name: Date of Service: [name], [name] 05/09/2020 9:45 A M Medical Record Number: [id number] Patient Account Number: [id number] Date of Birth/Sex: Treating RN: 11/06/1945 (74 y.o. F) Dwiggins, Shannon Primary Care Yolanda Huffstetler: Grafton Folk CO BS Other Clinician: Referring Carlesha Seiple: Treating Alannie Amodio/Extender: Sharlette Dense, JA CO BS Weeks in Treatment: 4 Visit Information History Since Last Visit Added or deleted any medications: No Patient Arrived: Wheel Chair Any new allergies or adverse reactions: No Arrival Time: 10:16 Had a fall or experienced change in No Accompanied By: caregiver activities of daily living that may affect Transfer Assistance: Manual risk of falls: Patient Identification Verified: Yes Signs or symptoms of abuse/neglect since last visito No Secondary Verification Process Completed: Yes Hospitalized since last visit: No Implantable device outside of the clinic excluding No cellular tissue based products placed in the center since last visit: Has Dressing in Place as Prescribed: Yes Pain Present Now: No Electronic Signature(s) Signed: 05/09/2020 4:06:40 PM By: Cherylin Mylar Entered By: Cherylin Mylar on 05/09/2020 10:17:17 -------------------------------------------------------------------------------- Clinic Level of Care Assessment Details Patient Name: Date of Service: [name], [name] 05/09/2020 9:45 A M Medical Record Number: [id number] Patient Account Number: [id number] Date of Birth/Sex: Treating RN: 08/22/1946 (74 y.o. Wynelle Link Primary Care Liv Rallis: Grafton Folk CO BS Other Clinician: Referring Laketta Soderberg: Treating Keelia Graybill/Extender: Sharlette Dense, JA CO BS Weeks in Treatment: 4 Clinic Level of Care Assessment Items TOOL 4 Quantity Score X- 1 0 Use when only an EandM is performed on FOLLOW-UP visit ASSESSMENTS - Nursing  Assessment / Reassessment X- 1 10 Reassessment of Co-morbidities (includes updates in patient status) X- 1 5 Reassessment of Adherence to Treatment Plan ASSESSMENTS - Wound and Skin A ssessment / Reassessment []  - 0 Simple Wound Assessment / Reassessment - one wound X- 13 5 Complex Wound Assessment / Reassessment - multiple wounds []  - 0 Dermatologic / Skin Assessment (not related to wound area) ASSESSMENTS - Focused Assessment []  - 0 Circumferential Edema Measurements - multi extremities []  - 0 Nutritional Assessment / Counseling / Intervention X- 1 5 Lower Extremity Assessment (monofilament, tuning fork, pulses) []  - 0 Peripheral Arterial Disease Assessment (using hand held doppler) ASSESSMENTS - Ostomy and/or Continence Assessment and Care []  - 0 Incontinence Assessment and Management []  - 0 Ostomy Care Assessment and Management (repouching, etc.) PROCESS - Coordination of Care X - Simple Patient / Family Education for ongoing care 1 15 []  - 0 Complex (extensive) Patient / Family Education for ongoing care X- 1 10 Staff obtains , Records, T Results / Process Orders est X- 1 10 Staff telephones HHA, Nursing Homes / Clarify orders / etc []  - 0 Routine Transfer to another Facility (non-emergent condition) []  - 0 Routine Hospital Admission (non-emergent condition) []  - 0 New Admissions / / Ordering NPWT Apligraf, etc. , []  - 0 Emergency Hospital Admission (emergent condition) X- 1 10 Simple Discharge Coordination []  - 0 Complex (extensive) Discharge Coordination PROCESS - Special Needs []  - 0 Pediatric / Minor Patient Management []  - 0 Isolation Patient Management []  - 0 Hearing / Language / Visual special needs []  - 0 Assessment of Community assistance (transportation, D/C planning, etc.) []  - 0 Additional assistance / Altered mentation []  - 0 Support Surface(s) Assessment (bed, cushion, seat, etc.) INTERVENTIONS - Wound  Cleansing / Measurement []  - 0 Simple Wound Cleansing - one wound X- 13 5 Complex Wound Cleansing - multiple wounds X- 1 5  Wound Imaging (photographs - any number of wounds) []  - 0 Wound Tracing (instead of photographs) []  - 0 Simple Wound Measurement - one wound X- 13 5 Complex Wound Measurement - multiple wounds INTERVENTIONS - Wound Dressings []  - 0 Small Wound Dressing one or multiple wounds []  - 0 Medium Wound Dressing one or multiple wounds X- 2 20 Large Wound Dressing one or multiple wounds []  - 0 Application of Medications - topical []  - 0 Application of Medications - injection INTERVENTIONS - Miscellaneous []  - 0 External ear exam []  - 0 Specimen Collection (cultures, biopsies, blood, body fluids, etc.) []  - 0 Specimen(s) / Culture(s) sent or taken to Lab for analysis []  - 0 Patient Transfer (multiple staff / Nurse, adult / Similar devices) []  - 0 Simple Staple / Suture removal (25 or less) []  - 0 Complex Staple / Suture removal (26 or more) []  - 0 Hypo / Hyperglycemic Management (close monitor of Blood Glucose) []  - 0 Ankle / Brachial Index (ABI) - do not check if billed separately X- 1 5 Vital Signs Has the patient been seen at the hospital within the last three years: Yes Total Score: 310 Level Of Care: New/Established - Level 5 Electronic Signature(s) Signed: 05/09/2020 4:21:43 PM By: Zandra Abts RN, BSN Entered By: Zandra Abts on 05/09/2020 11:59:21 -------------------------------------------------------------------------------- Lower Extremity Assessment Details Patient Name: Date of Service: [name], [name] 05/09/2020 9:45 A M Medical Record Number: [id number] Patient Account Number: [id number] Date of Birth/Sex: Treating RN: 1946-03-08 (74 y.o. Harvest Dark Primary Care Ryleigh Esqueda: Grafton Folk CO BS Other Clinician: Referring Dolan Xia: Treating Diasha Castleman/Extender: Sharlette Dense, JA CO BS Weeks in Treatment: 4 Edema  Assessment Assessed: [Left: No] [Right: No] Edema: [Left: No] [Right: No] Calf Left: Right: Point of Measurement: 38 cm From Medial Instep 27 cm 29 cm Ankle Left: Right: Point of Measurement: 8 cm From Medial Instep 19 cm 18 cm Vascular Assessment Pulses: Dorsalis Pedis Palpable: [Left:No] [Right:No] Electronic Signature(s) Signed: 05/09/2020 4:06:40 PM By: Cherylin Mylar Entered By: Cherylin Mylar on 05/09/2020 10:23:26 -------------------------------------------------------------------------------- Multi Wound Chart Details Patient Name: Date of Service: [name], [name] 05/09/2020 9:45 A M Medical Record Number: [id number] Patient Account Number: [id number] Date of Birth/Sex: Treating RN: 07/31/1946 (74 y.o. Wynelle Link Primary Care Iisha Soyars: Grafton Folk CO BS Other Clinician: Referring Cutberto Winfree: Treating Maliaka Brasington/Extender: Sharlette Dense, JA CO BS Weeks in Treatment: 4 Vital Signs Height(in): 62 Pulse(bpm): 99 Weight(lbs): 158 Blood Pressure(mmHg): 142/65 Body Mass Index(BMI): 29 Temperature(F): 98.5 Respiratory Rate(breaths/min): 19 Photos: [1:No Photos Left, Anterior T Great oe] [10:No Photos Right T Fourth oe] [11:No Photos Right T Fifth oe] Wound Location: [1:Gradually Appeared] [10:Gradually Appeared] [11:Gradually Appeared] Wounding Event: [1:Diabetic Wound/Ulcer of the Lower] [10:Diabetic Wound/Ulcer of the Lower] [11:Diabetic Wound/Ulcer of the Lower] Primary Etiology: [1:Extremity Chronic Obstructive Pulmonary] [10:Extremity Chronic Obstructive Pulmonary] [11:Extremity Chronic Obstructive Pulmonary] Comorbid History: [1:Disease (COPD), Congestive Heart Failure, Hypertension, Peripheral Venous Disease, Type II Diabetes 12/17/2019] [10:Disease (COPD), Congestive Heart Failure, Hypertension, Peripheral Venous Disease, Type II Diabetes 12/17/2019]  [11:Disease (COPD), Congestive Heart Failure, Hypertension, Peripheral Venous Disease, Type II Diabetes  12/17/2019] Date Acquired: [1:4] [10:4] [11:4] Weeks of Treatment: [1:Open] [10:Open] [11:Open] Wound Status: [1:0.4x0.4x0.1] [10:0.6x0.6x0.1] [11:0.4x0.4x0.1] Measurements L x W x D (cm) [1:0.126] [10:0.283] [11:0.126] A (cm) : rea [1:0.013] [10:0.028] [11:0.013] Volume (cm) : [1:-77.50%] [10:85.60%] [11:0.00%] % Reduction in A rea: [1:-85.70%] [10:85.70%] [11:0.00%] % Reduction in Volume: [1:Grade 2] [10:Grade 2] [11:Grade 2] Classification: [1:Medium] [10:Medium] [11:Medium] Exudate A mount: [1:Serous] [  10:Serous] [11:Serous] Exudate Type: [1:amber] [10:amber] [11:amber] Exudate Color: [1:No] [10:No] [11:No] Foul Odor A Cleansing: [1:fter N/A] [10:N/A] [11:N/A] Odor A nticipated Due to Product Use: [1:Distinct, outline attached] [10:Distinct, outline attached] [11:Distinct, outline attached] Wound Margin: [1:None Present (0%)] [10:Medium (34-66%)] [11:Medium (34-66%)] Granulation A mount: [1:N/A] [10:Pink] [11:Pink] Granulation Quality: [1:Large (67-100%)] [10:Medium (34-66%)] [11:Medium (34-66%)] Necrotic A mount: [1:Adherent Slough] [10:Adherent Slough] [11:Adherent Slough] Necrotic Tissue: [1:Fascia: No] [10:Fat Layer (Subcutaneous Tissue): Yes Fat Layer (Subcutaneous Tissue): Yes] Exposed Structures: [1:Fat Layer (Subcutaneous Tissue): No Tendon: No Muscle: No Joint: No Bone: No None] [10:Fascia: No Tendon: No Muscle: No Joint: No Bone: No None] [11:Fascia: No Tendon: No Muscle: No Joint: No Bone: No None] Wound Number: [id number]  Photos: No Photos No Photos No Photos Right Calcaneus Left, Plantar Foot Right, Posterior T Great oe Wound Location: Gradually Appeared Gradually Appeared Gradually Appeared Wounding Event: Diabetic Wound/Ulcer of the Lower Diabetic Wound/Ulcer of the Lower Diabetic Wound/Ulcer of the Lower Primary Etiology: Extremity Extremity Extremity Chronic Obstructive Pulmonary Chronic Obstructive Pulmonary Chronic Obstructive Pulmonary Comorbid  History: Disease (COPD), Congestive Heart Disease (COPD), Congestive Heart Disease (COPD), Congestive Heart Failure, Hypertension, Peripheral Failure, Hypertension, Peripheral Failure, Hypertension, Peripheral Venous Disease, Type II Diabetes Venous Disease, Type II Diabetes Venous Disease, Type II Diabetes 12/17/2019 05/09/2020 12/17/2019 Date Acquired: 4 0 4 Weeks of Treatment: Open Open Healed - Epithelialized Wound Status: 1.5x0.7x0.1 1.1x1x0.1 0x0x0 Measurements L x W x D (cm) 0.825 0.864 0 A (cm) : rea 0.082 0.086 0 Volume (cm) : 50.00% N/A 100.00% % Reduction in A rea: 50.30% N/A 100.00% % Reduction in Volume: Grade 2 Unable to visualize wound bed Grade 2 Classification: Medium Small None Present Exudate A mount: Serous Serous N/A Exudate Type: amber amber N/A Exudate Color: No Yes No Foul Odor A Cleansing: fter N/A No N/A Odor A nticipated Due to Product Use: Flat and Intact Distinct, outline attached Distinct, outline attached Wound Margin: Medium (34-66%) None Present (0%) None Present (0%) Granulation A mount: Pink N/A N/A Granulation Quality: Medium (34-66%) Large (67-100%) None Present (0%) Necrotic A mount: Adherent Slough Eschar, Adherent Slough N/A Necrotic Tissue: Fat Layer (Subcutaneous Tissue): Yes Fascia: No Fascia: No Exposed Structures: Fascia: No Fat Layer (Subcutaneous Tissue): No Fat Layer (Subcutaneous Tissue): No Tendon: No Tendon: No Tendon: No Muscle: No Muscle: No Muscle: No Joint: No Joint: No Joint: No Bone: No Bone: No Bone: No None None Large (67-100%) Epithelialization: Wound Number: 3 4 5  Photos: No Photos No Photos No Photos Left T Second oe Left T Third oe Left T Fourth oe Wound Location: Gradually Appeared Gradually Appeared Gradually Appeared Wounding Event: Diabetic Wound/Ulcer of the Lower Diabetic Wound/Ulcer of the Lower Diabetic Wound/Ulcer of the Lower Primary Etiology: Extremity Extremity  Extremity Chronic Obstructive Pulmonary Chronic Obstructive Pulmonary Chronic Obstructive Pulmonary Comorbid History: Disease (COPD), Congestive Heart Disease (COPD), Congestive Heart Disease (COPD), Congestive Heart Failure, Hypertension, Peripheral Failure, Hypertension, Peripheral Failure, Hypertension, Peripheral Venous Disease, Type II Diabetes Venous Disease, Type II Diabetes Venous Disease, Type II Diabetes 12/17/2019 12/17/2019 12/17/2019 Date Acquired: 4 4 4  Weeks of Treatment: Open Open Open Wound Status: 0.2x0.2x0.1 0.2x0.4x0.1 2.4x2.3x0.1 Measurements L x W x D (cm) 0.031 0.063 4.335 A (cm) : rea 0.003 0.006 0.434 Volume (cm) : 56.30% -103.20% -75.20% % Reduction in A rea: 57.10% -100.00% -75.70% % Reduction in Volume: Grade 2 Grade 2 Grade 2 Classification: Medium Medium Medium Exudate A mount: Serous Serous Purulent Exudate Type: amber amber yellow, brown, green Exudate Color: No  No Yes Foul Odor A Cleansing: fter N/A N/A No Odor A nticipated Due to Product Use: Distinct, outline attached Flat and Intact Distinct, outline attached Wound Margin: None Present (0%) None Present (0%) None Present (0%) Granulation A mount: N/A N/A N/A Granulation Quality: Large (67-100%) Large (67-100%) Large (67-100%) Necrotic A mount: Adherent Slough Adherent [employer], Adherent Slough Necrotic Tissue: Fascia: No Fascia: No Fascia: No Exposed Structures: Fat Layer (Subcutaneous Tissue): No Fat Layer (Subcutaneous Tissue): No Fat Layer (Subcutaneous Tissue): No Tendon: No Tendon: No Tendon: No Muscle: No Muscle: No Muscle: No Joint: No Joint: No Joint: No Bone: No Bone: No Bone: No None None None Epithelialization: Wound Number: Photos: No Photos No Photos No Photos Left, Medial Foot Left, Lateral Foot Left Calcaneus Wound Location: Gradually Appeared Gradually Appeared Gradually Appeared Wounding Event: Diabetic Wound/Ulcer of the Lower  Diabetic Wound/Ulcer of the Lower Diabetic Wound/Ulcer of the Lower Primary Etiology: Extremity Extremity Extremity Chronic Obstructive Pulmonary Chronic Obstructive Pulmonary Chronic Obstructive Pulmonary Comorbid History: Disease (COPD), Congestive Heart Disease (COPD), Congestive Heart Disease (COPD), Congestive Heart Failure, Hypertension, Peripheral Failure, Hypertension, Peripheral Failure, Hypertension, Peripheral Venous Disease, Type II Diabetes Venous Disease, Type II Diabetes Venous Disease, Type II Diabetes 12/17/2019 12/17/2019 12/17/2019 Date Acquired: Weeks of Treatment: Open Open Open Wound Status: 0.7x0.8x0.1 2x1.5x0.2 0.2x0.2x0.1 Measurements L x W x D (cm) 0.44 2.356 0.031 A (cm) : rea 0.044 0.471 0.003 Volume (cm) : 53.70% 52.00% 92.70% % Reduction in A rea: 53.70% 52.00% 92.90% % Reduction in Volume: Grade 2 Grade 2 Grade 2 Classification: Medium Medium Medium Exudate A mount: Serous Purulent Serous Exudate Type: amber yellow, brown, green amber Exudate Color: Yes Yes No Foul Odor A Cleansing: fter No No N/A Odor A nticipated Due to Product Use: Distinct, outline attached Well defined, not attached Distinct, outline attached Wound Margin: None Present (0%) N/A None Present (0%) Granulation A mount: N/A N/A N/A Granulation Quality: Large (67-100%) N/A Large (67-100%) Necrotic A mount: Adherent Slough Eschar, Adherent Slough Adherent Slough Necrotic Tissue: Fascia: No Fascia: No Fascia: No Exposed Structures: Fat Layer (Subcutaneous Tissue): No Fat Layer (Subcutaneous Tissue): No Fat Layer (Subcutaneous Tissue): No Tendon: No Tendon: No Tendon: No Muscle: No Muscle: No Muscle: No Joint: No Joint: No Joint: No Bone: No Bone: No Bone: No None None None Epithelialization: Wound Number: 9 N/A N/A Photos: No Photos N/A N/A Right T Third oe N/A N/A Wound Location: Gradually Appeared N/A N/A Wounding Event: Diabetic Wound/Ulcer  of the Lower N/A N/A Primary Etiology: Extremity Chronic Obstructive Pulmonary N/A N/A Comorbid History: Disease (COPD), Congestive Heart Failure, Hypertension, Peripheral Venous Disease, Type II Diabetes 12/17/2019 N/A N/A Date Acquired: 4 N/A N/A Weeks of Treatment: Open N/A N/A Wound Status: 0.3x0.3x0.1 N/A N/A Measurements L x W x D (cm) 0.071 N/A N/A A (cm) : rea 0.007 N/A N/A Volume (cm) : 0.00% N/A N/A % Reduction in A rea: 0.00% N/A N/A % Reduction in Volume: Grade 2 N/A N/A Classification: Medium N/A N/A Exudate A mount: Serosanguineous N/A N/A Exudate Type: red, brown N/A N/A Exudate Color: No N/A N/A Foul Odor A Cleansing: fter N/A N/A N/A Odor A nticipated Due to Product Use: Flat and Intact N/A N/A Wound Margin: None Present (0%) N/A N/A Granulation A mount: N/A N/A N/A Granulation Quality: Large (67-100%) N/A N/A Necrotic A mount: Adherent Slough N/A N/A Necrotic Tissue: Fascia: No N/A N/A Exposed Structures: Fat Layer (Subcutaneous Tissue): No Tendon: No Muscle: No  Joint: No Bone: No None N/A N/A Epithelialization: Treatment Notes Electronic Signature(s) Signed: 05/09/2020 4:21:43 PM By: Zandra Abts RN, BSN Signed: 05/09/2020 4:33:08 PM By: Baltazar Najjar MD Entered By: Baltazar Najjar on 05/09/2020 11:17:58 -------------------------------------------------------------------------------- Multi-Disciplinary Care Plan Details Patient Name: Date of Service: [name], [name] 05/09/2020 9:45 A M Medical Record Number: [id number] Patient Account Number: [id number] Date of Birth/Sex: Treating RN: May 01, 1946 (74 y.o. Wynelle Link Primary Care Malon Branton: Grafton Folk CO BS Other Clinician: Referring Mahalie Kanner: Treating Yaron Grasse/Extender: Sharlette Dense, JA CO BS Weeks in Treatment: 4 Active Inactive Wound/Skin Impairment Nursing Diagnoses: Impaired tissue integrity Goals: Patient/caregiver will verbalize understanding  of skin care regimen Date Initiated: 05/09/2020 Target Resolution Date: 06/10/2020 Goal Status: Active Ulcer/skin breakdown will have a volume reduction of 30% by week 4 Date Initiated: 04/08/2020 Date Inactivated: 05/09/2020 Target Resolution Date: 05/06/2020 Goal Status: Unmet Unmet Reason: PAD Interventions: Assess patient/caregiver ability to obtain necessary supplies Assess patient/caregiver ability to perform ulcer/skin care regimen upon admission and as needed Assess ulceration(s) every visit Provide education on ulcer and skin care Notes: Electronic Signature(s) Signed: 05/09/2020 4:21:43 PM By: Zandra Abts RN, BSN Entered By: Zandra Abts on 05/09/2020 11:50:16 -------------------------------------------------------------------------------- Pain Assessment Details Patient Name: Date of Service: [name], Kaelen 05/09/2020 9:45 A M Medical Record Number: [id number] Patient Account Number: [id number] Date of Birth/Sex: Treating RN: Nov 19, 1945 (74 y.o. Harvest Dark Primary Care Mickayla Trouten: Grafton Folk CO BS Other Clinician: Referring Kiwan Gadsden: Treating Abdulah Iqbal/Extender: Sharlette Dense, JA CO BS Weeks in Treatment: 4 Active Problems Location of Pain Severity and Description of Pain Patient Has Paino No Site Locations Pain Management and Medication Current Pain Management: Electronic Signature(s) Signed: 05/09/2020 4:06:40 PM By: Cherylin Mylar Entered By: Cherylin Mylar on 05/09/2020 10:17:55 -------------------------------------------------------------------------------- Patient/Caregiver Education Details Patient Name: Date of Service: [name], Floye 8/23/2021andnbsp9:45 A M Medical Record Number: [id number] Patient Account Number: [id number] Date of Birth/Gender: Treating RN: [birth year] (74 y.o. Wynelle Link Primary Care Physician: Grafton Folk CO BS Other Clinician: Referring Physician: Treating Physician/Extender: Ulyses Amor CO BS Weeks in Treatment: 4 Education Assessment Education Provided To: Patient Education Topics Provided Wound/Skin Impairment: Methods: Explain/Verbal Responses: State content correctly Electronic Signature(s) Signed: 05/09/2020 4:21:43 PM By: Zandra Abts RN, BSN Entered By: Zandra Abts on 05/09/2020 11:50:30 -------------------------------------------------------------------------------- Wound Assessment Details Patient Name: Date of Service: [name], [name] 05/09/2020 9:45 A M Medical Record Number: [id number] Patient Account Number: [id number] Date of Birth/Sex: Treating RN: [birth year] (74 y.o. F) Dwiggins, Shannon Primary Care Marcques Wrightsman: Grafton Folk CO BS Other Clinician: Referring Jaymes Revels: Treating Kaelan Amble/Extender: Sharlette Dense, JA CO BS Weeks in Treatment: 4 Wound Status Wound Number: 1 Primary Diabetic Wound/Ulcer of the Lower Extremity Etiology: Wound Location: Left, Anterior T Great oe Wound Open Wounding Event: Gradually Appeared Status: Date Acquired: 12/17/2019 Comorbid Chronic Obstructive Pulmonary Disease (COPD), Congestive Heart Weeks Of Treatment: 4 History: Failure, Hypertension, Peripheral Venous Disease, Type II Diabetes Clustered Wound: No Wound Measurements Length: (cm) 0.4 Width: (cm) 0.4 Depth: (cm) 0.1 Area: (cm) 0.126 Volume: (cm) 0.013 % Reduction in Area: -77.5% % Reduction in Volume: -85.7% Epithelialization: None Tunneling: No Undermining: No Wound Description Classification: Grade 2 Wound Margin: Distinct, outline attached Exudate Amount: Medium Exudate Type: Serous Exudate Color: amber Foul Odor After Cleansing: No Slough/Fibrino Yes Wound Bed Granulation Amount: None Present (0%) Exposed Structure Necrotic Amount: Large (67-100%) Fascia Exposed: No Necrotic Quality: Adherent Slough Fat Layer (Subcutaneous Tissue) Exposed: No Tendon Exposed: No Muscle Exposed: No  Joint Exposed:  No Bone Exposed: No Electronic Signature(s) Signed: 05/09/2020 4:06:40 PM By: Cherylin Mylar Entered By: Cherylin Mylar on 05/09/2020 10:39:37 -------------------------------------------------------------------------------- Wound Assessment Details Patient Name: Date of Service: [name], Jennie 05/09/2020 9:45 A M Medical Record Number: [id number] Patient Account Number: [id number] Date of Birth/Sex: Treating RN: 05/26/1946 (74 y.o. F) Dwiggins, Shannon Primary Care Hodaya Curto: Grafton Folk CO BS Other Clinician: Referring Mansel Strother: Treating Evett Kassa/Extender: Sharlette Dense, JA CO BS Weeks in Treatment: 4 Wound Status Wound Number: 10 Primary Diabetic Wound/Ulcer of the Lower Extremity Etiology: Wound Location: Right T Fourth oe Wound Open Wounding Event: Gradually Appeared Status: Date Acquired: 12/17/2019 Comorbid Chronic Obstructive Pulmonary Disease (COPD), Congestive Heart Weeks Of Treatment: 4 History: Failure, Hypertension, Peripheral Venous Disease, Type II Diabetes Clustered Wound: No Wound Measurements Length: (cm) 0.6 Width: (cm) 0.6 Depth: (cm) 0.1 Area: (cm) 0.283 Volume: (cm) 0.028 % Reduction in Area: 85.6% % Reduction in Volume: 85.7% Epithelialization: None Tunneling: No Undermining: No Wound Description Classification: Grade 2 Wound Margin: Distinct, outline attached Exudate Amount: Medium Exudate Type: Serous Exudate Color: amber Foul Odor After Cleansing: No Slough/Fibrino Yes Wound Bed Granulation Amount: Medium (34-66%) Exposed Structure Granulation Quality: Pink Fascia Exposed: No Necrotic Amount: Medium (34-66%) Fat Layer (Subcutaneous Tissue) Exposed: Yes Necrotic Quality: Adherent Slough Tendon Exposed: No Muscle Exposed: No Joint Exposed: No Bone Exposed: No Electronic Signature(s) Signed: 05/09/2020 4:06:40 PM By: Cherylin Mylar Entered By: Cherylin Mylar on 05/09/2020  10:36:55 -------------------------------------------------------------------------------- Wound Assessment Details Patient Name: Date of Service: [name], Willy 05/09/2020 9:45 A M Medical Record Number: [id number] Patient Account Number: [id number] Date of Birth/Sex: Treating RN: 1946-07-27 (74 y.o. F) Dwiggins, Shannon Primary Care Corran Lalone: Grafton Folk CO BS Other Clinician: Referring Djimon Lundstrom: Treating Denette Hass/Extender: Sharlette Dense, JA CO BS Weeks in Treatment: 4 Wound Status Wound Number: 11 Primary Diabetic Wound/Ulcer of the Lower Extremity Etiology: Wound Location: Right T Fifth oe Wound Open Wounding Event: Gradually Appeared Status: Date Acquired: 12/17/2019 Comorbid Chronic Obstructive Pulmonary Disease (COPD), Congestive Heart Weeks Of Treatment: 4 History: Failure, Hypertension, Peripheral Venous Disease, Type II Diabetes Clustered Wound: No Wound Measurements Length: (cm) 0.4 Width: (cm) 0.4 Depth: (cm) 0.1 Area: (cm) 0.126 Volume: (cm) 0.013 % Reduction in Area: 0% % Reduction in Volume: 0% Epithelialization: None Tunneling: No Undermining: No Wound Description Classification: Grade 2 Wound Margin: Distinct, outline attached Exudate Amount: Medium Exudate Type: Serous Exudate Color: amber Foul Odor After Cleansing: No Slough/Fibrino Yes Wound Bed Granulation Amount: Medium (34-66%) Exposed Structure Granulation Quality: Pink Fascia Exposed: No Necrotic Amount: Medium (34-66%) Fat Layer (Subcutaneous Tissue) Exposed: Yes Necrotic Quality: Adherent Slough Tendon Exposed: No Muscle Exposed: No Joint Exposed: No Bone Exposed: No Electronic Signature(s) Signed: 05/09/2020 4:06:40 PM By: Cherylin Mylar Entered By: Cherylin Mylar on 05/09/2020 10:36:20 -------------------------------------------------------------------------------- Wound Assessment Details Patient Name: Date of Service: [name], Rossetta 05/09/2020 9:45 A  M Medical Record Number: [id number] Patient Account Number: [id number] Date of Birth/Sex: Treating RN: [birth year] (74 y.o. 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, Hypertension, Peripheral Venous Disease, Type II Diabetes Clustered Wound: No Wound Measurements Length: (cm) 1.5 Width: (cm) 0.7 Depth: (cm) 0.1  Area: (cm) 0.825 Volume: (cm) 0.082 % Reduction in Area: 50% % Reduction in Volume: 50.3% Epithelialization: None Tunneling: No Undermining: No Wound Description Classification: Grade 2 Wound Margin: Flat and Intact Exudate Amount: Medium Exudate Type: Serous Exudate Color: amber Foul Odor After Cleansing: No Slough/Fibrino Yes Wound Bed Granulation Amount: Medium (34-66%) Exposed Structure Granulation Quality: Pink Fascia Exposed: No Necrotic Amount: Medium (34-66%) Fat Layer (Subcutaneous Tissue) Exposed: Yes Necrotic Quality: Adherent Slough Tendon Exposed: No Muscle Exposed: No Joint Exposed: No Bone Exposed: No Electronic Signature(s) Signed: 05/09/2020 4:06:40 PM By: Cherylin Mylar Entered By: Cherylin Mylar on 05/09/2020 10:37:28 -------------------------------------------------------------------------------- Wound Assessment Details Patient Name: Date of Service: [name], Garnetta 05/09/2020 9:45 A M Medical Record Number: [id number] Patient Account Number: [id number] Date of Birth/Sex: Treating RN: Feb 07, 1946 (74 y.o. F) Dwiggins, Shannon Primary Care Martasia Talamante: Grafton Folk CO BS Other  Clinician: Referring Alyla Pietila: Treating Essa Malachi/Extender: Sharlette Dense, JA CO BS Weeks in Treatment: 4 Wound Status Wound Number: 13 Primary Diabetic Wound/Ulcer of the Lower Extremity Etiology: Wound Location: Left, Plantar Foot Wound Open Wounding Event: Gradually Appeared Status: Date Acquired: 05/09/2020 Comorbid Chronic Obstructive Pulmonary Disease (COPD), Congestive Heart Weeks Of Treatment: 0 History: Failure, Hypertension, Peripheral Venous Disease, Type II Diabetes Clustered Wound: No Wound Measurements Length: (cm) 1.1 Width: (cm) 1 Depth: (cm) 0.1 Area: (cm) 0.864 Volume: (cm) 0.086 % Reduction in Area: % Reduction in Volume: Epithelialization: None Tunneling: No Undermining: No Wound Description Classification: Unable to visualize wound bed Wound Margin: Distinct, outline attached Exudate Amount: Small Exudate Type: Serous Exudate Color: amber Foul Odor After Cleansing: Yes Due to Product Use: No Slough/Fibrino No Wound Bed Granulation Amount: None Present (0%) Exposed Structure Necrotic Amount: Large (67-100%) Fascia Exposed: No Necrotic Quality: Eschar, Adherent Slough Fat Layer (Subcutaneous Tissue) Exposed: No Tendon Exposed: No Muscle Exposed: No Joint Exposed: No Bone Exposed: No Electronic Signature(s) Signed: 05/09/2020 4:06:40 PM By: Cherylin Mylar Entered By: Cherylin Mylar on 05/09/2020 10:35:07 -------------------------------------------------------------------------------- Wound Assessment Details Patient Name: Date of Service: [name], [name] 05/09/2020 9:45 A M Medical Record Number: [id number] Patient Account Number: [id number] Date of Birth/Sex: Treating RN: [birth year] (74 y.o. 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, Hypertension, Peripheral Venous Disease, Type II Diabetes History: Failure, Hypertension, Peripheral Venous Disease, Type II Diabetes Clustered Wound: No Wound Measurements Length: (cm) Width: (cm) Depth: (cm) Area: (cm) Volume: (cm) 0 % Reduction in Area: 100% 0 % Reduction in Volume: 100% 0 Epithelialization: Large (67-100%) 0 Tunneling: No 0 Undermining: No Wound Description Classification: Grade 2 Wound Margin: Distinct, outline attached Exudate Amount: None Present Foul Odor After Cleansing: No Slough/Fibrino No Wound Bed Granulation Amount: None Present (0%) Exposed Structure Necrotic Amount: None Present (0%) Fascia Exposed: No Fat Layer (Subcutaneous Tissue) Exposed: No Tendon Exposed: No Muscle Exposed: No Joint Exposed: No Bone Exposed: No Electronic Signature(s) Signed: 05/09/2020 4:06:40 PM By: Cherylin Mylar Entered By: Cherylin Mylar on 05/09/2020 10:38:07 -------------------------------------------------------------------------------- Wound Assessment Details Patient Name: Date of Service: [name], [name] 05/09/2020 9:45 A M Medical Record Number: [id number] Patient Account Number: [id number] Date of Birth/Sex: Treating RN: 1946-05-24 (74 y.o.  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, Hypertension, Peripheral Venous Disease, Type II Diabetes Clustered Wound: No Wound Measurements Length: (cm) 0.2 Width: (cm) 0.2 Depth: (cm) 0.1 Area: (cm) 0.031 Volume: (cm) 0.003 % Reduction in Area: 56.3% % Reduction in Volume: 57.1% Epithelialization: None Tunneling: No Undermining: No Wound Description Classification: Grade 2 Wound Margin: Distinct, outline attached Exudate Amount: Medium Exudate Type: Serous Exudate Color: amber Foul Odor After Cleansing: No Slough/Fibrino Yes Wound Bed Granulation Amount: None Present (0%) Exposed Structure Necrotic Amount: Large (67-100%) Fascia Exposed: No Necrotic Quality: Adherent Slough Fat Layer (Subcutaneous Tissue) Exposed: No Tendon Exposed: No Muscle Exposed: No Joint Exposed: No Bone Exposed: No Electronic Signature(s) Signed: 05/09/2020 4:06:40 PM By: Cherylin Mylar Entered By: Cherylin Mylar on 05/09/2020 10:41:48 -------------------------------------------------------------------------------- Wound Assessment Details Patient Name: Date of Service: [name], [name] 05/09/2020 9:45 A M Medical Record Number: [id number] Patient Account Number: [id number] Date of Birth/Sex: Treating RN: 07-03-1946 (74 y.o. 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, Hypertension, Peripheral Venous Disease, Type II Diabetes Clustered Wound: No Wound Measurements Length: (cm)  0.2 Width: (cm) 0.4 Depth: (cm) 0.1 Area: (cm) 0.063 Volume: (cm) 0.006 % Reduction in Area: -103.2% % Reduction in Volume: -100% Epithelialization: None Tunneling: No Undermining: No Wound Description Classification: Grade 2 Wound Margin: Flat and Intact Exudate Amount: Medium Exudate Type: Serous Exudate Color: amber Foul Odor After Cleansing: No Slough/Fibrino Yes Wound Bed Granulation Amount: None Present (0%) Exposed Structure Necrotic Amount: Large (67-100%) Fascia Exposed: No Necrotic Quality: Adherent Slough Fat Layer (Subcutaneous Tissue) Exposed: No Tendon Exposed: No Muscle Exposed: No Joint Exposed: No Bone Exposed: No Electronic Signature(s) Signed: 05/09/2020 4:06:40 PM By: Cherylin Mylar Entered By: Cherylin Mylar on 05/09/2020 10:39:14 -------------------------------------------------------------------------------- Wound Assessment Details Patient Name: Date of Service: [name], [name] 05/09/2020 9:45 A M Medical Record Number: [id number] Patient Account Number: [id number] Date of Birth/Sex: Treating RN: 03/04/1946 (74 y.o. 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, Hypertension, Peripheral Venous Disease, Type II Diabetes Clustered Wound: No Wound Measurements Length: (cm) 2.4 Width: (cm) 2.3 Depth: (cm) 0.1 Area: (cm) 4.335 Volume: (cm) 0.434 % Reduction in Area: -75.2% % Reduction in Volume: -75.7% Epithelialization: None Tunneling: No Undermining: No Wound  Description Classification: Grade 2 Wound Margin: Distinct, outline attached Exudate Amount: Medium Exudate Type: Purulent Exudate Color: yellow, brown, green Foul Odor After Cleansing: Yes Due to Product Use: No Slough/Fibrino Yes Wound Bed  Granulation Amount: None Present (0%) Exposed Structure Necrotic Amount: Large (67-100%) Fascia Exposed: No Necrotic Quality: Eschar, Adherent Slough Fat Layer (Subcutaneous Tissue) Exposed: No Tendon Exposed: No Muscle Exposed: No Joint Exposed: No Bone Exposed: No Electronic Signature(s) Signed: 05/09/2020 4:06:40 PM By: Cherylin Mylarwiggins, Shannon Entered By: Cherylin Mylarwiggins, Shannon on 05/09/2020 10:38:44 -------------------------------------------------------------------------------- Wound Assessment Details Patient Name: Date of Service: [name], Miesha 05/09/2020 9:45 A M Medical Record Number: [id number] Patient Account Number: [id number] Date of Birth/Sex: Treating RN: 1945-11-20 (74 y.o. 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, Hypertension, Peripheral Venous Disease, Type II Diabetes Clustered Wound: No Wound Measurements Length: (cm) 0.7 Width: (cm) 0.8 Depth: (cm) 0.1 Area: (cm) 0.44 Volume: (cm) 0.044 % Reduction in Area: 53.7% % Reduction in Volume: 53.7% Epithelialization: None Tunneling: No Undermining: No Wound Description Classification: Grade 2 Wound Margin: Distinct, outline attached Exudate Amount: Medium Exudate Type: Serous Exudate Color: amber Foul Odor After  Cleansing: Yes Due to Product Use: No Slough/Fibrino Yes Wound Bed Granulation Amount: None Present (0%) Exposed Structure Necrotic Amount: Large (67-100%) Fascia Exposed: No Necrotic Quality: Adherent Slough Fat Layer (Subcutaneous Tissue) Exposed: No Tendon Exposed: No Muscle Exposed: No Joint Exposed: No Bone Exposed: No Electronic Signature(s) Signed: 05/09/2020 4:06:40 PM By: Cherylin Mylarwiggins, Shannon Entered By: Cherylin Mylarwiggins, Shannon on 05/09/2020 10:40:11 -------------------------------------------------------------------------------- Wound Assessment Details Patient Name: Date of Service: [name], [name] 05/09/2020 9:45 A M Medical Record Number: [id number] Patient Account Number: [id number] Date of Birth/Sex: Treating RN: 1945-11-20 (74 y.o. 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, Hypertension, Peripheral Venous Disease, Type II Diabetes Clustered Wound: No Wound Measurements Length: (cm) 2 Width: (cm) 1.5 Depth: (cm) 0.2 Area: (cm) 2.356 Volume: (cm) 0.471 % Reduction in Area: 52% % Reduction in Volume: 52% Epithelialization: None Tunneling: No Undermining: No Wound Description Classification: Grade 2 Wound Margin: Well defined, not attached Exudate Amount: Medium Exudate Type: Purulent Exudate Color: yellow, brown, green Foul Odor After Cleansing: Yes Due to Product Use: No Slough/Fibrino Yes Wound Bed Necrotic Amount: Large (67-100%) Exposed Structure Necrotic Quality: Eschar, Adherent Slough Fascia  Exposed: No Fat Layer (Subcutaneous Tissue) Exposed: No Tendon Exposed: No Muscle Exposed: No Joint Exposed: No Bone Exposed: No Electronic Signature(s) Signed: 05/09/2020 4:06:40 PM By: Cherylin Mylarwiggins, Shannon Entered By: Cherylin Mylarwiggins, Shannon on 05/09/2020 10:40:48 -------------------------------------------------------------------------------- Wound Assessment Details Patient Name: Date of Service: [name], Jeannetta 05/09/2020 9:45 A M Medical Record Number: [id number] Patient Account Number: [id number] Date of Birth/Sex: Treating RN: 1945-11-20 (74 y.o. 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, Hypertension, Peripheral Venous Disease, Type II Diabetes Clustered Wound: No Wound Measurements Length: (cm) 0.2 Width: (cm) 0.2 Depth: (cm) 0.1 Area: (cm) 0.031 Volume: (cm) 0.003 % Reduction in Area: 92.7% % Reduction in Volume: 92.9% Epithelialization: None Tunneling: No Undermining: No Wound Description Classification: Grade 2 Wound Margin: Distinct, outline attached Exudate Amount: Medium Exudate Type: Serous Exudate Color: amber Foul Odor After Cleansing: No Slough/Fibrino Yes Wound Bed Granulation Amount: None Present (0%) Exposed Structure Necrotic Amount: Large (67-100%) Fascia Exposed: No Necrotic Quality: Adherent Slough Fat Layer (Subcutaneous Tissue) Exposed: No Tendon Exposed: No Muscle Exposed: No Joint Exposed: No Bone Exposed: No Electronic Signature(s) Signed: 05/09/2020 4:06:40 PM By: Cherylin Mylar Entered By: Cherylin Mylar on 05/09/2020 10:41:17 -------------------------------------------------------------------------------- Wound Assessment Details Patient Name: Date of Service: [name], [name] 05/09/2020 9:45 A M Medical Record Number: [id number] Patient Account Number: [id number] Date of Birth/Sex: Treating RN: 10-07-1945 (73 y.o. 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, Hypertension, Peripheral Venous Disease, Type II Diabetes History: Failure, Hypertension, Peripheral Venous Disease, Type II Diabetes Clustered Wound: No Wound Measurements Length: (cm) 0.3 Width: (cm) 0.3 Depth: (cm) 0.1 Area: (cm) 0.071 Volume: (cm) 0.007 % Reduction in Area: 0% % Reduction in Volume: 0% Epithelialization: None Tunneling: No Undermining: No Wound Description Classification: Grade 2 Wound Margin: Flat and Intact Exudate Amount: Medium Exudate Type: Serosanguineous Exudate Color: red, brown Foul Odor After Cleansing: No Slough/Fibrino Yes Wound Bed Granulation Amount: None Present (0%) Exposed Structure Necrotic Amount: Large (67-100%) Fascia Exposed: No Necrotic Quality: Adherent Slough Fat Layer (Subcutaneous Tissue) Exposed: No Tendon Exposed: No Muscle Exposed: No Joint Exposed: No Bone Exposed: No Electronic Signature(s) Signed: 05/09/2020 4:06:40 PM By: Cherylin Mylar Entered By: Cherylin Mylar on 05/09/2020  10:35:41 -------------------------------------------------------------------------------- Vitals Details Patient Name: Date of Service: [name], [name] 05/09/2020 9:45 A M Medical Record Number: [id number] Patient Account Number: [id number] Date of Birth/Sex: Treating RN: 1946/02/14 (74 y.o. F) Dwiggins, Shannon Primary Care Tyiesha Brackney: Grafton Folk CO BS Other Clinician: Referring Teruo Stilley: Treating Inessa Wardrop/Extender: Sharlette Dense, JA CO BS Weeks in Treatment: 4 Vital Signs Time Taken: 10:15 Temperature (F): 98.5 Height (in): 62 Pulse (bpm): 99 Weight (lbs): 158 Respiratory Rate (breaths/min): 19 Body Mass Index (BMI): 28.9 Blood Pressure (mmHg): 142/65 Reference Range: 80 - 120 mg / dl Electronic Signature(s) Signed: 05/09/2020 4:06:40 PM By: Cherylin Mylar Entered By: Cherylin Mylar on 05/09/2020 10:17:49

## 2020-06-04 ENCOUNTER — Emergency Department (HOSPITAL_COMMUNITY): Payer: Medicare Other

## 2020-06-04 ENCOUNTER — Inpatient Hospital Stay (HOSPITAL_COMMUNITY)
Admission: EM | Admit: 2020-06-04 | Discharge: 2020-06-09 | DRG: 871 | Disposition: A | Discharge status: Hospice | Payer: Medicare Other | Attending: Internal Medicine | Admitting: Internal Medicine

## 2020-06-04 ENCOUNTER — Other Ambulatory Visit: Payer: Self-pay

## 2020-06-04 ENCOUNTER — Encounter (HOSPITAL_COMMUNITY): Payer: Self-pay

## 2020-06-04 DIAGNOSIS — Z515 Encounter for palliative care: Secondary | ICD-10-CM

## 2020-06-04 DIAGNOSIS — Z79899 Other long term (current) drug therapy: Secondary | ICD-10-CM

## 2020-06-04 DIAGNOSIS — L8990 Pressure ulcer of unspecified site, unspecified stage: Secondary | ICD-10-CM | POA: Diagnosis present

## 2020-06-04 DIAGNOSIS — I70269 Atherosclerosis of native arteries of extremities with gangrene, unspecified extremity: Secondary | ICD-10-CM | POA: Diagnosis present

## 2020-06-04 DIAGNOSIS — Z7901 Long term (current) use of anticoagulants: Secondary | ICD-10-CM

## 2020-06-04 DIAGNOSIS — K117 Disturbances of salivary secretion: Secondary | ICD-10-CM

## 2020-06-04 DIAGNOSIS — I959 Hypotension, unspecified: Secondary | ICD-10-CM | POA: Diagnosis not present

## 2020-06-04 DIAGNOSIS — E1152 Type 2 diabetes mellitus with diabetic peripheral angiopathy with gangrene: Secondary | ICD-10-CM | POA: Diagnosis present

## 2020-06-04 DIAGNOSIS — E162 Hypoglycemia, unspecified: Secondary | ICD-10-CM

## 2020-06-04 DIAGNOSIS — R451 Restlessness and agitation: Secondary | ICD-10-CM | POA: Diagnosis present

## 2020-06-04 DIAGNOSIS — Z885 Allergy status to narcotic agent status: Secondary | ICD-10-CM

## 2020-06-04 DIAGNOSIS — I5021 Acute systolic (congestive) heart failure: Secondary | ICD-10-CM | POA: Diagnosis present

## 2020-06-04 DIAGNOSIS — Z882 Allergy status to sulfonamides status: Secondary | ICD-10-CM

## 2020-06-04 DIAGNOSIS — Z7189 Other specified counseling: Secondary | ICD-10-CM

## 2020-06-04 DIAGNOSIS — Z9981 Dependence on supplemental oxygen: Secondary | ICD-10-CM

## 2020-06-04 DIAGNOSIS — F0390 Unspecified dementia without behavioral disturbance: Secondary | ICD-10-CM | POA: Diagnosis present

## 2020-06-04 DIAGNOSIS — I480 Paroxysmal atrial fibrillation: Secondary | ICD-10-CM | POA: Diagnosis present

## 2020-06-04 DIAGNOSIS — L89613 Pressure ulcer of right heel, stage 3: Secondary | ICD-10-CM | POA: Diagnosis present

## 2020-06-04 DIAGNOSIS — E785 Hyperlipidemia, unspecified: Secondary | ICD-10-CM | POA: Diagnosis present

## 2020-06-04 DIAGNOSIS — I252 Old myocardial infarction: Secondary | ICD-10-CM

## 2020-06-04 DIAGNOSIS — D649 Anemia, unspecified: Secondary | ICD-10-CM

## 2020-06-04 DIAGNOSIS — Z66 Do not resuscitate: Secondary | ICD-10-CM | POA: Diagnosis present

## 2020-06-04 DIAGNOSIS — I739 Peripheral vascular disease, unspecified: Secondary | ICD-10-CM | POA: Diagnosis present

## 2020-06-04 DIAGNOSIS — N182 Chronic kidney disease, stage 2 (mild): Secondary | ICD-10-CM | POA: Diagnosis present

## 2020-06-04 DIAGNOSIS — R680 Hypothermia, not associated with low environmental temperature: Secondary | ICD-10-CM | POA: Diagnosis present

## 2020-06-04 DIAGNOSIS — R64 Cachexia: Secondary | ICD-10-CM | POA: Diagnosis present

## 2020-06-04 DIAGNOSIS — I2510 Atherosclerotic heart disease of native coronary artery without angina pectoris: Secondary | ICD-10-CM | POA: Diagnosis present

## 2020-06-04 DIAGNOSIS — L03032 Cellulitis of left toe: Secondary | ICD-10-CM

## 2020-06-04 DIAGNOSIS — I4892 Unspecified atrial flutter: Secondary | ICD-10-CM | POA: Diagnosis not present

## 2020-06-04 DIAGNOSIS — A419 Sepsis, unspecified organism: Principal | ICD-10-CM | POA: Diagnosis present

## 2020-06-04 DIAGNOSIS — I96 Gangrene, not elsewhere classified: Secondary | ICD-10-CM

## 2020-06-04 DIAGNOSIS — T68XXXA Hypothermia, initial encounter: Secondary | ICD-10-CM

## 2020-06-04 DIAGNOSIS — I471 Supraventricular tachycardia: Secondary | ICD-10-CM | POA: Diagnosis present

## 2020-06-04 DIAGNOSIS — Z933 Colostomy status: Secondary | ICD-10-CM

## 2020-06-04 DIAGNOSIS — Z881 Allergy status to other antibiotic agents status: Secondary | ICD-10-CM

## 2020-06-04 DIAGNOSIS — Z20822 Contact with and (suspected) exposure to covid-19: Secondary | ICD-10-CM | POA: Diagnosis present

## 2020-06-04 DIAGNOSIS — Z6826 Body mass index (BMI) 26.0-26.9, adult: Secondary | ICD-10-CM

## 2020-06-04 DIAGNOSIS — I429 Cardiomyopathy, unspecified: Secondary | ICD-10-CM | POA: Diagnosis present

## 2020-06-04 DIAGNOSIS — Z7982 Long term (current) use of aspirin: Secondary | ICD-10-CM

## 2020-06-04 DIAGNOSIS — Z794 Long term (current) use of insulin: Secondary | ICD-10-CM

## 2020-06-04 DIAGNOSIS — E11649 Type 2 diabetes mellitus with hypoglycemia without coma: Secondary | ICD-10-CM | POA: Diagnosis present

## 2020-06-04 DIAGNOSIS — D509 Iron deficiency anemia, unspecified: Secondary | ICD-10-CM | POA: Diagnosis present

## 2020-06-04 DIAGNOSIS — J189 Pneumonia, unspecified organism: Secondary | ICD-10-CM

## 2020-06-04 DIAGNOSIS — I313 Pericardial effusion (noninflammatory): Secondary | ICD-10-CM | POA: Diagnosis present

## 2020-06-04 DIAGNOSIS — I509 Heart failure, unspecified: Secondary | ICD-10-CM

## 2020-06-04 LAB — COMPREHENSIVE METABOLIC PANEL
ALT: 7 U/L (ref 0–44)
AST: 10 U/L — ABNORMAL LOW (ref 15–41)
Albumin: 2 g/dL — ABNORMAL LOW (ref 3.5–5.0)
Alkaline Phosphatase: 74 U/L (ref 38–126)
Anion gap: 6 (ref 5–15)
BUN: 28 mg/dL — ABNORMAL HIGH (ref 8–23)
CO2: 27 mmol/L (ref 22–32)
Calcium: 8.2 mg/dL — ABNORMAL LOW (ref 8.9–10.3)
Chloride: 105 mmol/L (ref 98–111)
Creatinine, Ser: 1.07 mg/dL — ABNORMAL HIGH (ref 0.44–1.00)
GFR calc Af Amer: >60 mL/min (ref >60)
GFR calc non Af Amer: 52 mL/min — ABNORMAL LOW (ref >60)
Glucose, Bld: 105 mg/dL — ABNORMAL HIGH (ref 70–99)
Potassium: 4.4 mmol/L (ref 3.5–5.1)
Sodium: 138 mmol/L (ref 135–145)
Total Bilirubin: 0.4 mg/dL (ref 0.3–1.2)
Total Protein: 6.4 g/dL — ABNORMAL LOW (ref 6.5–8.1)

## 2020-06-04 LAB — CBG MONITORING, ED: Glucose-Capillary: 104 mg/dL — ABNORMAL HIGH (ref 70–99)

## 2020-06-04 LAB — PROTIME-INR
INR: 1.8 — ABNORMAL HIGH (ref 0.8–1.2)
Prothrombin Time: 20.3 seconds — ABNORMAL HIGH (ref 11.4–15.2)

## 2020-06-04 LAB — CBC WITH DIFFERENTIAL/PLATELET
Abs Immature Granulocytes: 0.03 10*3/uL (ref 0.00–0.07)
Basophils Absolute: 0 10*3/uL (ref 0.0–0.1)
Basophils Relative: 0 %
Eosinophils Absolute: 0 10*3/uL (ref 0.0–0.5)
Eosinophils Relative: 0 %
HCT: 29.4 % — ABNORMAL LOW (ref 36.0–46.0)
Hemoglobin: 8.5 g/dL — ABNORMAL LOW (ref 12.0–15.0)
Immature Granulocytes: 0 %
Lymphocytes Relative: 8 %
Lymphs Abs: 1 10*3/uL (ref 0.7–4.0)
MCH: 23.4 pg — ABNORMAL LOW (ref 26.0–34.0)
MCHC: 28.9 g/dL — ABNORMAL LOW (ref 30.0–36.0)
MCV: 81 fL (ref 80.0–100.0)
Monocytes Absolute: 0.7 10*3/uL (ref 0.1–1.0)
Monocytes Relative: 6 %
Neutro Abs: 10.7 10*3/uL — ABNORMAL HIGH (ref 1.7–7.7)
Neutrophils Relative %: 86 %
Platelets: 302 10*3/uL (ref 150–400)
RBC: 3.63 MIL/uL — ABNORMAL LOW (ref 3.87–5.11)
RDW: 22.4 % — ABNORMAL HIGH (ref 11.5–15.5)
WBC: 12.6 10*3/uL — ABNORMAL HIGH (ref 4.0–10.5)
nRBC: 0 % (ref 0.0–0.2)

## 2020-06-04 LAB — LACTIC ACID, PLASMA: Lactic Acid, Venous: 1.1 mmol/L (ref 0.5–1.9)

## 2020-06-04 MED ORDER — DEXTROSE 50 % IV SOLN
50.0000 mL | Freq: Once | INTRAVENOUS | Status: AC
  Administered 2020-06-04: 50 mL via INTRAVENOUS
  Filled 2020-06-04: qty 50

## 2020-06-04 NOTE — ED Notes (Signed)
ED Provider at bedside. 

## 2020-06-04 NOTE — ED Notes (Signed)
Pt provided snack and OJ to drink.

## 2020-06-04 NOTE — ED Triage Notes (Signed)
Pt arrives via [facility] from [facility]. EMS was called for hypoglycemia. Per EMS, SNF administered two doses of Glucagon prior to their arrival. When EMS arrived at SNF, they report Pts CBG was 53. [facility] administered an amp of D50 and rechecked Pts CBG and read 215. Pt with Hx of dementia and alert at this time, disorriented to place, situation and time. Pt has PICC Line in right upper arm, foley catheter and IV established 20g Left Forearm. Pt received 100cc bolus enroute to [facility].

## 2020-06-05 DIAGNOSIS — Z20822 Contact with and (suspected) exposure to covid-19: Secondary | ICD-10-CM | POA: Diagnosis present

## 2020-06-05 DIAGNOSIS — E785 Hyperlipidemia, unspecified: Secondary | ICD-10-CM | POA: Diagnosis present

## 2020-06-05 DIAGNOSIS — I4892 Unspecified atrial flutter: Secondary | ICD-10-CM | POA: Diagnosis not present

## 2020-06-05 DIAGNOSIS — N182 Chronic kidney disease, stage 2 (mild): Secondary | ICD-10-CM | POA: Diagnosis present

## 2020-06-05 DIAGNOSIS — R451 Restlessness and agitation: Secondary | ICD-10-CM | POA: Diagnosis present

## 2020-06-05 DIAGNOSIS — R680 Hypothermia, not associated with low environmental temperature: Secondary | ICD-10-CM | POA: Diagnosis present

## 2020-06-05 DIAGNOSIS — Z515 Encounter for palliative care: Secondary | ICD-10-CM | POA: Diagnosis not present

## 2020-06-05 DIAGNOSIS — I471 Supraventricular tachycardia: Secondary | ICD-10-CM | POA: Diagnosis present

## 2020-06-05 DIAGNOSIS — I429 Cardiomyopathy, unspecified: Secondary | ICD-10-CM | POA: Diagnosis present

## 2020-06-05 DIAGNOSIS — I739 Peripheral vascular disease, unspecified: Secondary | ICD-10-CM | POA: Diagnosis not present

## 2020-06-05 DIAGNOSIS — I959 Hypotension, unspecified: Secondary | ICD-10-CM | POA: Diagnosis not present

## 2020-06-05 DIAGNOSIS — A419 Sepsis, unspecified organism: Secondary | ICD-10-CM | POA: Diagnosis present

## 2020-06-05 DIAGNOSIS — I509 Heart failure, unspecified: Secondary | ICD-10-CM | POA: Diagnosis not present

## 2020-06-05 DIAGNOSIS — J189 Pneumonia, unspecified organism: Secondary | ICD-10-CM

## 2020-06-05 DIAGNOSIS — I5021 Acute systolic (congestive) heart failure: Secondary | ICD-10-CM | POA: Diagnosis present

## 2020-06-05 DIAGNOSIS — R64 Cachexia: Secondary | ICD-10-CM | POA: Diagnosis present

## 2020-06-05 DIAGNOSIS — D649 Anemia, unspecified: Secondary | ICD-10-CM | POA: Diagnosis not present

## 2020-06-05 DIAGNOSIS — E1152 Type 2 diabetes mellitus with diabetic peripheral angiopathy with gangrene: Secondary | ICD-10-CM | POA: Diagnosis present

## 2020-06-05 DIAGNOSIS — E160 Drug-induced hypoglycemia without coma: Secondary | ICD-10-CM | POA: Diagnosis not present

## 2020-06-05 DIAGNOSIS — Z794 Long term (current) use of insulin: Secondary | ICD-10-CM | POA: Diagnosis not present

## 2020-06-05 DIAGNOSIS — E11649 Type 2 diabetes mellitus with hypoglycemia without coma: Secondary | ICD-10-CM | POA: Diagnosis present

## 2020-06-05 DIAGNOSIS — L89613 Pressure ulcer of right heel, stage 3: Secondary | ICD-10-CM | POA: Diagnosis present

## 2020-06-05 DIAGNOSIS — I70269 Atherosclerosis of native arteries of extremities with gangrene, unspecified extremity: Secondary | ICD-10-CM | POA: Diagnosis present

## 2020-06-05 DIAGNOSIS — E162 Hypoglycemia, unspecified: Secondary | ICD-10-CM | POA: Diagnosis present

## 2020-06-05 DIAGNOSIS — D509 Iron deficiency anemia, unspecified: Secondary | ICD-10-CM | POA: Diagnosis present

## 2020-06-05 DIAGNOSIS — I313 Pericardial effusion (noninflammatory): Secondary | ICD-10-CM | POA: Diagnosis present

## 2020-06-05 DIAGNOSIS — Z66 Do not resuscitate: Secondary | ICD-10-CM | POA: Diagnosis present

## 2020-06-05 DIAGNOSIS — I5023 Acute on chronic systolic (congestive) heart failure: Secondary | ICD-10-CM | POA: Diagnosis not present

## 2020-06-05 DIAGNOSIS — I480 Paroxysmal atrial fibrillation: Secondary | ICD-10-CM | POA: Diagnosis present

## 2020-06-05 DIAGNOSIS — F0390 Unspecified dementia without behavioral disturbance: Secondary | ICD-10-CM | POA: Diagnosis present

## 2020-06-05 DIAGNOSIS — I2510 Atherosclerotic heart disease of native coronary artery without angina pectoris: Secondary | ICD-10-CM | POA: Diagnosis present

## 2020-06-05 DIAGNOSIS — L03032 Cellulitis of left toe: Secondary | ICD-10-CM | POA: Diagnosis not present

## 2020-06-05 LAB — CBC
HCT: 24.4 % — ABNORMAL LOW (ref 36.0–46.0)
Hemoglobin: 7.2 g/dL — ABNORMAL LOW (ref 12.0–15.0)
MCH: 23.8 pg — ABNORMAL LOW (ref 26.0–34.0)
MCHC: 29.5 g/dL — ABNORMAL LOW (ref 30.0–36.0)
MCV: 80.8 fL (ref 80.0–100.0)
Platelets: 281 10*3/uL (ref 150–400)
RBC: 3.02 MIL/uL — ABNORMAL LOW (ref 3.87–5.11)
RDW: 22.3 % — ABNORMAL HIGH (ref 11.5–15.5)
WBC: 12 10*3/uL — ABNORMAL HIGH (ref 4.0–10.5)
nRBC: 0 % (ref 0.0–0.2)

## 2020-06-05 LAB — COMPREHENSIVE METABOLIC PANEL
ALT: 9 U/L (ref 0–44)
AST: 9 U/L — ABNORMAL LOW (ref 15–41)
Albumin: 1.7 g/dL — ABNORMAL LOW (ref 3.5–5.0)
Alkaline Phosphatase: 61 U/L (ref 38–126)
Anion gap: 8 (ref 5–15)
BUN: 28 mg/dL — ABNORMAL HIGH (ref 8–23)
CO2: 23 mmol/L (ref 22–32)
Calcium: 7.8 mg/dL — ABNORMAL LOW (ref 8.9–10.3)
Chloride: 107 mmol/L (ref 98–111)
Creatinine, Ser: 1.03 mg/dL — ABNORMAL HIGH (ref 0.44–1.00)
GFR calc Af Amer: >60 mL/min (ref >60)
GFR calc non Af Amer: 54 mL/min — ABNORMAL LOW (ref >60)
Glucose, Bld: 122 mg/dL — ABNORMAL HIGH (ref 70–99)
Potassium: 3.8 mmol/L (ref 3.5–5.1)
Sodium: 138 mmol/L (ref 135–145)
Total Bilirubin: 0.6 mg/dL (ref 0.3–1.2)
Total Protein: 5.5 g/dL — ABNORMAL LOW (ref 6.5–8.1)

## 2020-06-05 LAB — RETICULOCYTES
Immature Retic Fract: 22.3 % — ABNORMAL HIGH (ref 2.3–15.9)
RBC.: 3.47 MIL/uL — ABNORMAL LOW (ref 3.87–5.11)
Retic Count, Absolute: 49.6 10*3/uL (ref 19.0–186.0)
Retic Ct Pct: 1.4 % (ref 0.4–3.1)

## 2020-06-05 LAB — URINALYSIS, ROUTINE W REFLEX MICROSCOPIC
Bilirubin Urine: NEGATIVE
Glucose, UA: NEGATIVE mg/dL
Ketones, ur: NEGATIVE mg/dL
Nitrite: NEGATIVE
Protein, ur: NEGATIVE mg/dL
Specific Gravity, Urine: 1.006 (ref 1.005–1.030)
pH: 5 (ref 5.0–8.0)

## 2020-06-05 LAB — IRON AND TIBC
Iron: 9 ug/dL — ABNORMAL LOW (ref 28–170)
Saturation Ratios: 4 % — ABNORMAL LOW (ref 10.4–31.8)
TIBC: 202 ug/dL — ABNORMAL LOW (ref 250–450)
UIBC: 193 ug/dL

## 2020-06-05 LAB — FOLATE: Folate: 12.2 ng/mL (ref >5.9)

## 2020-06-05 LAB — CBG MONITORING, ED
Glucose-Capillary: 135 mg/dL — ABNORMAL HIGH (ref 70–99)
Glucose-Capillary: 157 mg/dL — ABNORMAL HIGH (ref 70–99)
Glucose-Capillary: 158 mg/dL — ABNORMAL HIGH (ref 70–99)
Glucose-Capillary: 54 mg/dL — ABNORMAL LOW (ref 70–99)
Glucose-Capillary: 88 mg/dL (ref 70–99)
Glucose-Capillary: 90 mg/dL (ref 70–99)

## 2020-06-05 LAB — SARS CORONAVIRUS 2 BY RT PCR (HOSPITAL ORDER, PERFORMED IN ~~LOC~~ HOSPITAL LAB): SARS Coronavirus 2: NEGATIVE

## 2020-06-05 LAB — BRAIN NATRIURETIC PEPTIDE: B Natriuretic Peptide: 3003 pg/mL — ABNORMAL HIGH (ref 0.0–100.0)

## 2020-06-05 LAB — FERRITIN: Ferritin: 64 ng/mL (ref 11–307)

## 2020-06-05 LAB — MAGNESIUM: Magnesium: 1.8 mg/dL (ref 1.7–2.4)

## 2020-06-05 LAB — VITAMIN B12: Vitamin B-12: 1009 pg/mL — ABNORMAL HIGH (ref 180–914)

## 2020-06-05 LAB — LACTIC ACID, PLASMA: Lactic Acid, Venous: 1.4 mmol/L (ref 0.5–1.9)

## 2020-06-05 LAB — TSH: TSH: 6.004 u[IU]/mL — ABNORMAL HIGH (ref 0.350–4.500)

## 2020-06-05 MED ORDER — LORAZEPAM 2 MG/ML IJ SOLN: 0.5000 mg | Freq: Once | INTRAMUSCULAR | Status: AC

## 2020-06-05 MED ORDER — FAMOTIDINE 20 MG PO TABS
20.0000 mg | ORAL_TABLET | Freq: Every day | ORAL | Status: DC
  Filled 2020-06-05: qty 1

## 2020-06-05 MED ORDER — VANCOMYCIN HCL 1250 MG/250ML IV SOLN
1250.0000 mg | Freq: Once | INTRAVENOUS | Status: AC
  Administered 2020-06-05: 1250 mg via INTRAVENOUS
  Filled 2020-06-05: qty 250

## 2020-06-05 MED ORDER — SODIUM CHLORIDE 0.9 % IV SOLN
2.0000 g | Freq: Once | INTRAVENOUS | Status: AC
  Administered 2020-06-05: 2 g via INTRAVENOUS
  Filled 2020-06-05: qty 2

## 2020-06-05 MED ORDER — SODIUM CHLORIDE 0.9 % IV SOLN
100.0000 mg | Freq: Two times a day (BID) | INTRAVENOUS | Status: DC
  Administered 2020-06-05 – 2020-06-07 (×5): 100 mg via INTRAVENOUS
  Filled 2020-06-05 (×8): qty 100

## 2020-06-05 MED ORDER — ACETAMINOPHEN 650 MG RE SUPP: 650.0000 mg | Freq: Four times a day (QID) | RECTAL | Status: DC | PRN

## 2020-06-05 MED ORDER — SODIUM CHLORIDE 0.9 % IV SOLN
1.0000 g | INTRAVENOUS | Status: DC
  Administered 2020-06-05 – 2020-06-06 (×2): 1 g via INTRAVENOUS
  Filled 2020-06-05 (×2): qty 10

## 2020-06-05 MED ORDER — OXYCODONE HCL 5 MG PO TABS: 5.0000 mg | ORAL_TABLET | ORAL | Status: DC | PRN

## 2020-06-05 MED ORDER — LORAZEPAM 2 MG/ML IJ SOLN
0.5000 mg | Freq: Four times a day (QID) | INTRAMUSCULAR | Status: DC | PRN
  Administered 2020-06-05 (×2): 0.5 mg via INTRAVENOUS
  Filled 2020-06-05 (×2): qty 1

## 2020-06-05 MED ORDER — GABAPENTIN 100 MG PO CAPS
200.0000 mg | ORAL_CAPSULE | Freq: Three times a day (TID) | ORAL | Status: DC
  Administered 2020-06-07: 200 mg via ORAL
  Filled 2020-06-05: qty 2

## 2020-06-05 MED ORDER — QUETIAPINE FUMARATE 25 MG PO TABS
50.0000 mg | ORAL_TABLET | Freq: Every day | ORAL | Status: DC
  Administered 2020-06-07 – 2020-06-08 (×2): 50 mg via ORAL
  Filled 2020-06-05 (×3): qty 2

## 2020-06-05 MED ORDER — METOPROLOL TARTRATE 25 MG PO TABS
12.5000 mg | ORAL_TABLET | Freq: Every day | ORAL | Status: DC
  Filled 2020-06-05: qty 1

## 2020-06-05 MED ORDER — APIXABAN 5 MG PO TABS
5.0000 mg | ORAL_TABLET | Freq: Two times a day (BID) | ORAL | Status: DC
  Administered 2020-06-07: 5 mg via ORAL
  Filled 2020-06-05 (×2): qty 1

## 2020-06-05 MED ORDER — FUROSEMIDE 10 MG/ML IJ SOLN
40.0000 mg | Freq: Once | INTRAMUSCULAR | Status: AC
  Administered 2020-06-05: 40 mg via INTRAVENOUS
  Filled 2020-06-05: qty 4

## 2020-06-05 MED ORDER — LORAZEPAM 2 MG/ML IJ SOLN
INTRAMUSCULAR | Status: AC
  Administered 2020-06-05: 0.5 mg via INTRAVENOUS
  Filled 2020-06-05: qty 1

## 2020-06-05 MED ORDER — POLYETHYLENE GLYCOL 3350 17 G PO PACK: 17.0000 g | PACK | Freq: Every day | ORAL | Status: DC | PRN

## 2020-06-05 MED ORDER — ACETAMINOPHEN 325 MG PO TABS: 650.0000 mg | ORAL_TABLET | Freq: Four times a day (QID) | ORAL | Status: DC | PRN

## 2020-06-05 NOTE — ED Notes (Signed)
Pt's daughter called to check on pt status; informed her no change in pt and still waiting on a bed

## 2020-06-05 NOTE — ED Notes (Signed)
Pt becoming increasingly agitated. Pt attempting to pull on lines and tamper with medical equipment. MD Notified.

## 2020-06-05 NOTE — ED Provider Notes (Signed)
[name] Provider Note   CSN: [id number] Arrival date & time: 06/04/20  2157   Time seen 11:45 PM  History Chief Complaint  Patient presents with  . Hypoglycemia   Level 5 caveat for dementia  [name] is a 74 y.o. female.  HPI Patient presents from [facility], nursing home by EMS for altered mental status.  EMS reports she has been hypoglycemic at the facility and they gave her 2 doses of glucagon.  When EMS arrived her CBG was 53.  They gave her an amp of D50 and rechecked her CBG and it was 215.  Patient does not answer any questions but she is able to respond if something is hurting her.  She can say yes or no to that.  Patient has diabetes and is on insulin.  PCP Lindaann Pascal     Past Medical History:  Diagnosis Date  . A-fib (HCC)   . Anemia   . CHF (congestive heart failure) (HCC)   . Chronic kidney disease   . Colostomy present (HCC)   . Dementia (HCC)   . Diabetes mellitus without complication (HCC)   . Foley catheter in place   . Heart disease   . Hyperlipidemia   . Myocardial infarction (HCC)   . Pressure ulcer     Patient Active Problem List   Diagnosis Date Noted  . Cerebral thrombosis with cerebral infarction 03/29/2020  . Pressure injury of skin 03/26/2020  . 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 PAD (peripheral artery disease) (HCC) 03/25/2020  . Peripheral arterial disease (HCC) 03/25/2020    Past Surgical History:  Procedure Laterality Date  . ABDOMINAL AORTOGRAM W/LOWER EXTREMITY Bilateral 03/25/2020   Procedure: ABDOMINAL AORTOGRAM W/LOWER EXTREMITY;  Surgeon: Chuck Hint, MD;  Location: [facility] INVASIVE CV LAB;  Service: Cardiovascular;  Laterality: Bilateral;  . COLOSTOMY       OB History   No obstetric history on file.     History reviewed. No pertinent family history.  Social History   Tobacco Use  . Smoking status: Unknown If Ever Smoked  . Smokeless  tobacco: Never Used  Substance Use Topics  . Alcohol use: Not Currently  . Drug use: Not Currently  Lives in a nursing home  Home Medications Prior to Admission medications   Medication Sig Start Date End Date Taking? Authorizing Provider  acetaminophen (TYLENOL) 500 MG tablet Take 1,000 mg by mouth in the morning and at bedtime.    [provider]  Amino Acids-Protein Hydrolys (FEEDING SUPPLEMENT, PRO-STAT SUGAR FREE 64,) LIQD Take 30 mLs by mouth daily with breakfast.    [provider]  apixaban (ELIQUIS) 5 MG TABS tablet Take 5 mg by mouth 2 (two) times daily. Patient not taking: Reported on 03/02/2020    [provider]  Ascorbic Acid (VITAMIN C) 500 MG CHEW Chew 500 mg by mouth in the morning and at bedtime.     [provider]  aspirin EC 81 MG tablet Take 81 mg by mouth daily.    [provider]  Cholecalciferol (VITAMIN D) 50 MCG (2000 UT) tablet Take 2,000 Units by mouth daily.     [provider]  famotidine (PEPCID) 20 MG tablet Take 20 mg by mouth daily with breakfast.     [provider]  gabapentin (NEURONTIN) 100 MG capsule Take 200 mg by mouth 3 (three) times daily.    [provider]  insulin detemir (LEVEMIR) 100 UNIT/ML  injection Inject 14 Units into the skin daily.     [provider]  insulin lispro (HUMALOG KWIKPEN) 100 UNIT/ML KwikPen Inject 0-10 Units into the skin 3 (three) times daily with meals. Dose per sliding scale. 0-150 = 0 units; 151-200 = 2 units; 201-250 = 4 units; 251-300 = 6 units; 301-350 = 8 units; 351-400 = 10 units; >400, notify MD.    [provider]  LORazepam (ATIVAN) 0.5 MG tablet Take 0.5 mg by mouth every 6 (six) hours as needed for anxiety (agitation).    [provider]  metoprolol tartrate (LOPRESSOR) 25 MG tablet Take 12.5 mg by mouth daily.    [provider]  Multiple Vitamin (MULTIVITAMIN) capsule Take 1 capsule by mouth daily.     [provider]  polyethylene glycol (MIRALAX / GLYCOLAX) 17 g packet Take 17 g by mouth daily as needed (constipation).     [provider]  QUEtiapine (SEROQUEL) 50 MG tablet Take 50 mg by mouth at bedtime.    [provider]  tamsulosin (FLOMAX) 0.4 MG CAPS capsule Take 0.4 mg by mouth at bedtime.     [provider]  zinc sulfate 220 (50 Zn) MG capsule Take 220 mg by mouth daily.    [provider]    Allergies    Codeine, Erythromycin, and Sulfa antibiotics  Review of Systems   Review of Systems  Unable to perform ROS: Dementia    Physical Exam Updated Vital Signs BP 115/60 (BP Location: Left Arm)   Pulse 85   Temp 99.4 F (37.4 C) (Rectal)   Resp 20   Ht 5\' 2"  (1.575 m)   Wt 69.9 kg   SpO2 100%   BMI 28.17 kg/m   Physical Exam Vitals and nursing note reviewed.  Constitutional:      Appearance: She is normal weight.     Comments: Patient is actively trying to remove the heating blanket off of her, she is moving her feet and her arms.  HENT:     Head: Normocephalic and atraumatic.     Right Ear: External ear normal.     Left Ear: External ear normal.  Eyes:     Extraocular Movements: Extraocular movements intact.     Conjunctiva/sclera: Conjunctivae normal.  Cardiovascular:     Rate and Rhythm: Normal rate and regular rhythm.     Pulses: Normal pulses.  Pulmonary:     Effort: Pulmonary effort is normal. No respiratory distress.     Breath sounds: Normal breath sounds.  Abdominal:     General: Bowel sounds are normal.     Palpations: Abdomen is soft.     Comments: Patient has a colostomy in the right midabdomen, Foley catheter is in place.  Musculoskeletal:        General: Normal range of motion.     Cervical back: Normal range of motion.  Skin:    General: Skin is warm and dry.  Neurological:     Mental Status: She is alert. Mental status is at baseline.  Psychiatric:     Comments: Unable to assess     ED  Results / Procedures / Treatments   Labs (all labs ordered are listed, but only abnormal results are displayed) Results for orders placed or performed during the [facility] encounter of 06/04/20  SARS Coronavirus 2 by RT PCR ([facility] order, performed in [facility] lab) Nasopharyngeal Nasopharyngeal Swab   Specimen: Nasopharyngeal Swab  Result Value Ref Range   SARS  Coronavirus 2 NEGATIVE NEGATIVE  Comprehensive metabolic panel  Result Value Ref Range   Sodium 138 135 - 145 mmol/L   Potassium 4.4 3.5 - 5.1 mmol/L   Chloride 105 98 - 111 mmol/L   CO2 27 22 - 32 mmol/L   Glucose, Bld 105 (H) 70 - 99 mg/dL   BUN 28 (H) 8 - 23 mg/dL   Creatinine, Ser 5.57 (H) 0.44 - 1.00 mg/dL   Calcium 8.2 (L) 8.9 - 10.3 mg/dL   Total Protein 6.4 (L) 6.5 - 8.1 g/dL   Albumin 2.0 (L) 3.5 - 5.0 g/dL   AST 10 (L) 15 - 41 U/L   ALT 7 0 - 44 U/L   Alkaline Phosphatase 74 38 - 126 U/L   Total Bilirubin 0.4 0.3 - 1.2 mg/dL   GFR calc non Af Amer 52 (L) >60 mL/min   GFR calc Af Amer >60 >60 mL/min   Anion gap 6 5 - 15  Lactic acid, plasma  Result Value Ref Range   Lactic Acid, Venous 1.1 0.5 - 1.9 mmol/L  Lactic acid, plasma  Result Value Ref Range   Lactic Acid, Venous 1.4 0.5 - 1.9 mmol/L  CBC with Differential  Result Value Ref Range   WBC 12.6 (H) 4.0 - 10.5 K/uL   RBC 3.63 (L) 3.87 - 5.11 MIL/uL   Hemoglobin 8.5 (L) 12.0 - 15.0 g/dL   HCT 32.2 (L) 36 - 46 %   MCV 81.0 80.0 - 100.0 fL   MCH 23.4 (L) 26.0 - 34.0 pg   MCHC 28.9 (L) 30.0 - 36.0 g/dL   RDW 02.5 (H) 42.7 - 06.2 %   Platelets 302 150 - 400 K/uL   nRBC 0.0 0.0 - 0.2 %   Neutrophils Relative % 86 %   Neutro Abs 10.7 (H) 1.7 - 7.7 K/uL   Lymphocytes Relative 8 %   Lymphs Abs 1.0 0.7 - 4.0 K/uL   Monocytes Relative 6 %   Monocytes Absolute 0.7 0 - 1 K/uL   Eosinophils Relative 0 %   Eosinophils Absolute 0.0 0 - 0 K/uL   Basophils Relative 0 %   Basophils Absolute 0.0 0 - 0 K/uL   Immature Granulocytes 0 %   Abs Immature  Granulocytes 0.03 0.00 - 0.07 K/uL  Protime-INR  Result Value Ref Range   Prothrombin Time 20.3 (H) 11.4 - 15.2 seconds   INR 1.8 (H) 0.8 - 1.2  Brain natriuretic peptide  Result Value Ref Range   B Natriuretic Peptide 3,003.0 (H) 0.0 - 100.0 pg/mL  POC CBG, ED  Result Value Ref Range   Glucose-Capillary 104 (H) 70 - 99 mg/dL  CBG monitoring, ED  Result Value Ref Range   Glucose-Capillary 54 (L) 70 - 99 mg/dL  CBG monitoring, ED  Result Value Ref Range   Glucose-Capillary 157 (H) 70 - 99 mg/dL  CBG monitoring, ED  Result Value Ref Range   Glucose-Capillary 158 (H) 70 - 99 mg/dL   Laboratory interpretation all normal except hyperglycemia, malnutrition, subtherapeutic INR, leukocytosis, anemia    EKG EKG Interpretation  Date/Time:  Saturday June 04 2020 22:15:26 EDT Ventricular Rate:  67 PR Interval:    QRS Duration: 128 QT Interval:  392 QTC Calculation: 414 R Axis:   8 Text Interpretation: Atrial fibrillation with a competing junctional pacemaker Non-specific intra-ventricular conduction block Nonspecific T wave abnormality Since last tracing rate slower 29 Mar 2020 Confirmed by Devoria Albe ([id number]) on 06/04/2020 11:04:31 PM   Radiology DG  Chest 2 View  Result Date: 06/04/2020 CLINICAL DATA:  Hypoglycemia. EXAM: CHEST - 2 VIEW COMPARISON:  March 16, 2020 FINDINGS: A right-sided PICC line is noted with its distal tip seen at the junction of the superior vena cava and right atrium. Mild, diffuse chronic appearing increased interstitial lung markings are seen. Marked severity areas of atelectasis and/or infiltrate are seen within the bilateral lung bases, right greater than left. Small bilateral pleural effusions are noted. No pneumothorax is seen. The cardiac silhouette is markedly enlarged and unchanged in size. Degenerative changes seen throughout the thoracic spine. A chronic deformity is seen involving the surgical neck of the proximal right humerus. IMPRESSION: 1. Chronic  appearing increased interstitial lung markings with marked severity bibasilar atelectasis and/or infiltrate, right greater than left. 2. Small bilateral pleural effusions. 3. Stable cardiomegaly. Electronically Signed   By: Aram Candelahaddeus  Houston M.D.   On: 06/04/2020 23:11    Procedures .Critical Care Performed by: Devoria AlbeKnapp, Nathania Waldman, MD Authorized by: Devoria AlbeKnapp, Kien Mirsky, MD   Critical care provider statement:    Critical care time (minutes):  45   Critical care was necessary to treat or prevent imminent or life-threatening deterioration of the following conditions:  Endocrine crisis and cardiac failure   Critical care was time spent personally by me on the following activities:  Discussions with consultants, examination of patient, obtaining history from patient or surrogate, ordering and review of laboratory studies, ordering and review of radiographic studies, pulse oximetry and re-evaluation of patient's condition   (including critical care time)  Medications Ordered in ED Medications  vancomycin (VANCOREADY) IVPB 1250 mg/250 mL (1,250 mg Intravenous New Bag/Given (Non-Interop) 06/05/20 0358)  furosemide (LASIX) injection 40 mg (has no administration in time range)  dextrose 50 % solution 50 mL (50 mLs Intravenous Given 06/04/20 2344)  LORazepam (ATIVAN) injection 0.5 mg (0.5 mg Intravenous Given 06/05/20 0105)  ceFEPIme (MAXIPIME) 2 g in sodium chloride 0.9 % 100 mL IVPB (0 g Intravenous Stopped 06/05/20 0359)    ED Course  I have reviewed the triage vital signs and the nursing notes.  Pertinent labs & imaging results that were available during my care of the patient were reviewed by me and considered in my medical decision making (see chart for details).    MDM Rules/Calculators/A&P                          Patient CBG when she arrived in the ED was 104.  Repeat about an hour later had dropped down to 54.  She was given another amp of D50 and she was given some food.  Repeat CBG then was 157.  If her  blood sugar drops again I am going to start her on IV of D5 normal saline to maintain her glucose in a more normal range.  Patient's temperature when she arrived in the ED was 94.8 degrees rectally.  She was placed on a warming blanket.  Cultures were obtained.  Patient became increasingly agitated and kept trying to pull off the warming blanket get out of her stretcher.  She was given Ativan 0.5 mg IV, she takes it orally.  It did not and prove her behavior and she was placed in soft restraints for a period of time.  Her soft restraints were removed somewhere between 2 and 2:30 AM.  TSH was added to her blood work to complete her evaluation of hypothermia.  Patient's x-ray was concerning for possible new infiltrates.  Since she  resides in a nursing home she was started on healthcare associated pneumonia antibiotics.  Patient CBG was closely monitored.  At 2:30 AM it was 158.  Repeat rectal temp shows her temperature has improved to 99.4 and the heating blanket was removed.  Patient now is noted to have some rattling respirations.  When I listen to her I hear rattling with her breathing.  She does have her head at a odd angle because of the pillow.  Her pulse ox is 100% with her nasal cannula oxygen.  BNP was added to her blood work.  Patient's BNP is very elevated.  She was given Lasix 40 mg IV for possible congestive heart failure.  She did have a lot of rattling or rales in her lungs when I listened to her.  4:07 AM Dr.Zierle-Ghosh, hospitalist will admit.   Review of her last admission with discharge summary July 13 so she was on Eliquis not Coumadin.  Final Clinical Impression(s) / ED Diagnoses Final diagnoses:  Hypoglycemia  Hypothermia, initial encounter  Pneumonia due to infectious organism, unspecified laterality, unspecified part of lung  Congestive heart failure, unspecified HF chronicity, unspecified heart failure type (HCC)  Anemia, unspecified type    Rx / DC Orders  Plan  admission  Devoria Albe, MD, Concha Pyo, MD 06/05/20 [id number]

## 2020-06-05 NOTE — H&P (Signed)
TRH H&P    Patient Demographics:    [name], is a 74 y.o. female  MRN: [id number]  DOB - 26-Jun-1946  Admit Date - 06/04/2020  Referring MD/NP/PA: Lynelle Doctor  Outpatient Primary MD for the patient is Conetoe, Edwards  Patient coming from: SNF  Chief complaint- Hypoglycemia   HPI:    [name]  is a 74 y.o. female, with history of MI, pressure ulcer, HLD, CAD, Chronic indwelling catheter, colostomy present, CHF, Afib, and renal insufficiency was sent here from the SNF for hypoglycemia. When EMS arrived at the SNF that had already given multiple doses of glucagon. CBG at EMS arrival was in the 50s. They gave an Amp of D50 and it improved to the 200s. In the ED her glucose dropped again to 54, and she was given an amp of D50 and encouraged to eat. Her glucose stabilized after that. She has not required a dextrose drip as of yet.   At the time of my exam, patient is non verbal. Apparently her baseline is to say yes and no, but it is unclear if she is actually aware of what she is answering. She is likely non verbal for me secondary to ativan that was given for agitation. No further complaints were noted by the nursing home. She wears 2L [place] at baseline, and is saturating well on    In the ED T 94.8 to 99.4, HR 73, R 20, BP 115/60, 100% on 2LNC Covid Negative Blood culture pending Lactic acid 1.4 WBC 12.6 BNP 3003 CXR = Atelectasis or infiltrate R>L, sm BL pleural effusions Restraints for agitation Cefepime and Vanc started in the ED Ativan for agitation Amp D50   Review of systems:    In addition to the HPI above,   Review of Systems  Unable to perform ROS: Dementia      Past History of the following :    Past Medical History:  Diagnosis Date  . A-fib (HCC)   . Anemia   . CHF (congestive heart failure) (HCC)   . Chronic kidney disease   . Colostomy present (HCC)   . Dementia (HCC)     . Diabetes mellitus without complication (HCC)   . Foley catheter in place   . Heart disease   . Hyperlipidemia   . Myocardial infarction (HCC)   . Pressure ulcer       Past Surgical History:  Procedure Laterality Date  . ABDOMINAL AORTOGRAM W/LOWER EXTREMITY Bilateral 03/25/2020   Procedure: ABDOMINAL AORTOGRAM W/LOWER EXTREMITY;  Surgeon: Chuck Hint, MD;  Location: [facility] INVASIVE CV LAB;  Service: Cardiovascular;  Laterality: Bilateral;  . COLOSTOMY        Social History:      Social History   Tobacco Use  . Smoking status: Unknown If Ever Smoked  . Smokeless tobacco: Never Used  Substance Use Topics  . Alcohol use: Not Currently       Family History :    History reviewed. No pertinent family history.  This history could not be obtained with patient's altered  mental status   Home Medications:   Prior to Admission medications   Medication Sig Start Date End Date Taking? Authorizing Provider  acetaminophen (TYLENOL) 500 MG tablet Take 1,000 mg by mouth in the morning and at bedtime.    [provider]  Amino Acids-Protein Hydrolys (FEEDING SUPPLEMENT, PRO-STAT SUGAR FREE 64,) LIQD Take 30 mLs by mouth daily with breakfast.    [provider]  apixaban (ELIQUIS) 5 MG TABS tablet Take 5 mg by mouth 2 (two) times daily. Patient not taking: Reported on 03/02/2020    [provider]  Ascorbic Acid (VITAMIN C) 500 MG CHEW Chew 500 mg by mouth in the morning and at bedtime.     [provider]  aspirin EC 81 MG tablet Take 81 mg by mouth daily.    [provider]  Cholecalciferol (VITAMIN D) 50 MCG (2000 UT) tablet Take 2,000 Units by mouth daily.     [provider]  famotidine (PEPCID) 20 MG tablet Take 20 mg by mouth daily with breakfast.     [provider]  gabapentin (NEURONTIN) 100 MG capsule Take 200 mg by mouth 3 (three) times daily.    [provider]  insulin detemir (LEVEMIR) 100  UNIT/ML injection Inject 14 Units into the skin daily.     [provider]  insulin lispro (HUMALOG KWIKPEN) 100 UNIT/ML KwikPen Inject 0-10 Units into the skin 3 (three) times daily with meals. Dose per sliding scale. 0-150 = 0 units; 151-200 = 2 units; 201-250 = 4 units; 251-300 = 6 units; 301-350 = 8 units; 351-400 = 10 units; >400, notify MD.    [provider]  LORazepam (ATIVAN) 0.5 MG tablet Take 0.5 mg by mouth every 6 (six) hours as needed for anxiety (agitation).    [provider]  metoprolol tartrate (LOPRESSOR) 25 MG tablet Take 12.5 mg by mouth daily.    [provider]  Multiple Vitamin (MULTIVITAMIN) capsule Take 1 capsule by mouth daily.    [provider]  polyethylene glycol (MIRALAX / GLYCOLAX) 17 g packet Take 17 g by mouth daily as needed (constipation).     [provider]  QUEtiapine (SEROQUEL) 50 MG tablet Take 50 mg by mouth at bedtime.    [provider]  tamsulosin (FLOMAX) 0.4 MG CAPS capsule Take 0.4 mg by mouth at bedtime.     [provider]  zinc sulfate 220 (50 Zn) MG capsule Take 220 mg by mouth daily.    [provider]     Allergies:     Allergies  Allergen Reactions  . Codeine   . Erythromycin   . Sulfa Antibiotics      Physical Exam:   Vitals  Blood pressure 115/60, pulse 85, temperature 99.4 F (37.4 C), temperature source Rectal, resp. rate 20, height 5\' 2"  (1.575 m), weight 69.9 kg, SpO2 100 %.  1.  General: Laying right lateral decubitus in bed - responsive to painful stimuli, protecting airway  2. Psychiatric: Could not be assessed due to patient's sedated state  3. Neurologic: Neuro exam could not be completed due to patient's sedated state She opens eyes to painful stimuli and then returns to sleep Protecting airway  4. HEENMT:  Head is atraumatic, normocephalic Mucous membranes moist Neck is cachectic  Trachea is midline  5. Respiratory : Coarse  rhonchi, crackles at the bases No wheezing No labored breathing  6. Cardiovascular : H normal, rhythm is irregularly irregular No peripheral edema  7. Gastrointestinal:  Abdomen is soft, non distended, non tender to palpation  8. Skin:  No acute lesions on limited skin exam Pressure relief pads on heels  9.Musculoskeletal:  No peripheral edema No acute deformity    Data Review:    CBC Recent Labs  Lab 06/04/20 2220  WBC 12.6*  HGB 8.5*  HCT 29.4*  PLT 302  MCV 81.0  MCH 23.4*  MCHC 28.9*  RDW 22.4*  LYMPHSABS 1.0  MONOABS 0.7  EOSABS 0.0  BASOSABS 0.0   ------------------------------------------------------------------------------------------------------------------  Results for orders placed or performed during the hospital encounter of 06/04/20 (from the past 48 hour(s))  POC CBG, ED     Status: Abnormal   Collection Time: 06/04/20 10:19 PM  Result Value Ref Range   Glucose-Capillary 104 (H) 70 - 99 mg/dL    Comment: Glucose reference range applies only to samples taken after fasting for at least 8 hours.  Comprehensive metabolic panel     Status: Abnormal   Collection Time: 06/04/20 10:20 PM  Result Value Ref Range   Sodium 138 135 - 145 mmol/L   Potassium 4.4 3.5 - 5.1 mmol/L   Chloride 105 98 - 111 mmol/L   CO2 27 22 - 32 mmol/L   Glucose, Bld 105 (H) 70 - 99 mg/dL    Comment: Glucose reference range applies only to samples taken after fasting for at least 8 hours.   BUN 28 (H) 8 - 23 mg/dL   Creatinine, Ser 9.601.07 (H) 0.44 - 1.00 mg/dL   Calcium 8.2 (L) 8.9 - 10.3 mg/dL   Total Protein 6.4 (L) 6.5 - 8.1 g/dL   Albumin 2.0 (L) 3.5 - 5.0 g/dL   AST 10 (L) 15 - 41 U/L   ALT 7 0 - 44 U/L   Alkaline Phosphatase 74 38 - 126 U/L   Total Bilirubin 0.4 0.3 - 1.2 mg/dL   GFR calc non Af Amer 52 (L) >60 mL/min   GFR calc Af Amer >60 >60 mL/min   Anion gap 6 5 - 15    Comment: Performed at [facility], [address] [place], [place] [address]  Lactic  acid, plasma     Status: None   Collection Time: 06/04/20 10:20 PM  Result Value Ref Range   Lactic Acid, Venous 1.1 0.5 - 1.9 mmol/L    Comment: Performed at [facility], [address] [place], [place] [address]  CBC with Differential     Status: Abnormal   Collection Time: 06/04/20 10:20 PM  Result Value Ref Range   WBC 12.6 (H) 4.0 - 10.5 K/uL   RBC 3.63 (L) 3.87 - 5.11 MIL/uL   Hemoglobin 8.5 (L) 12.0 - 15.0 g/dL   HCT 47.829.4 (L) 36 - 46 %   MCV 81.0 80.0 - 100.0 fL   MCH 23.4 (L) 26.0 - 34.0 pg   MCHC 28.9 (L) 30.0 - 36.0 g/dL   RDW 29.522.4 (H) 62.111.5 - 30.815.5 %   Platelets 302 150 - 400 K/uL   nRBC 0.0 0.0 - 0.2 %   Neutrophils Relative % 86 %   Neutro Abs 10.7 (H) 1.7 - 7.7 K/uL   Lymphocytes Relative 8 %   Lymphs Abs 1.0 0.7 - 4.0 K/uL   Monocytes Relative 6 %   Monocytes Absolute 0.7 0 - 1 K/uL   Eosinophils Relative 0 %   Eosinophils Absolute 0.0 0 - 0 K/uL   Basophils Relative 0 %   Basophils Absolute 0.0 0 - 0  K/uL   Immature Granulocytes 0 %   Abs Immature Granulocytes 0.03 0.00 - 0.07 K/uL    Comment: Performed at [facility], [address] [place], [place] [address]  Protime-INR     Status: Abnormal   Collection Time: 06/04/20 10:20 PM  Result Value Ref Range   Prothrombin Time 20.3 (H) 11.4 - 15.2 seconds   INR 1.8 (H) 0.8 - 1.2    Comment: (NOTE) INR goal varies based on device and disease states. Performed at [facility], [address] [place], [place] [address]   Brain natriuretic peptide     Status: Abnormal   Collection Time: 06/04/20 10:20 PM  Result Value Ref Range   B Natriuretic Peptide 3,003.0 (H) 0.0 - 100.0 pg/mL    Comment: Performed at [facility], [address] [place], [place] [address]  CBG monitoring, ED     Status: Abnormal   Collection Time: 06/04/20 11:39 PM  Result Value Ref Range   Glucose-Capillary 54 (L) 70 - 99 mg/dL    Comment: Glucose reference range applies only to samples taken after fasting for at least 8 hours.  Lactic  acid, plasma     Status: None   Collection Time: 06/04/20 11:49 PM  Result Value Ref Range   Lactic Acid, Venous 1.4 0.5 - 1.9 mmol/L    Comment: Performed at [facility], [address], [place], [place] [address]  CBG monitoring, ED     Status: Abnormal   Collection Time: 06/05/20 12:23 AM  Result Value Ref Range   Glucose-Capillary 157 (H) 70 - 99 mg/dL    Comment: Glucose reference range applies only to samples taken after fasting for at least 8 hours.  CBG monitoring, ED     Status: Abnormal   Collection Time: 06/05/20  2:30 AM  Result Value Ref Range   Glucose-Capillary 158 (H) 70 - 99 mg/dL    Comment: Glucose reference range applies only to samples taken after fasting for at least 8 hours.  SARS Coronavirus 2 by RT PCR (hospital order, performed in [facility] hospital lab) Nasopharyngeal Nasopharyngeal Swab     Status: None   Collection Time: 06/05/20  2:39 AM   Specimen: Nasopharyngeal Swab  Result Value Ref Range   SARS Coronavirus 2 NEGATIVE NEGATIVE    Comment: (NOTE) SARS-CoV-2 target nucleic acids are NOT DETECTED.  The SARS-CoV-2 RNA is generally detectable in upper and lower respiratory specimens during the acute phase of infection. The lowest concentration of SARS-CoV-2 viral copies this assay can detect is 250 copies / mL. A negative result does not preclude SARS-CoV-2 infection and should not be used as the sole basis for treatment or other patient management decisions.  A negative result may occur with improper specimen collection / handling, submission of specimen other than nasopharyngeal swab, presence of viral mutation(s) within the areas targeted by this assay, and inadequate number of viral copies (<250 copies / mL). A negative result must be combined with clinical observations, patient history, and epidemiological information.  Fact Sheet for Patients:   BoilerBrush.com.cy  Fact Sheet for Healthcare  Providers: https://pope.com/  This test is not yet approved or  cleared by the [place] FDA and has been authorized for detection and/or diagnosis of SARS-CoV-2 by FDA under an Emergency Use Authorization (EUA).  This EUA will remain in effect (meaning this test can be used) for the duration of the COVID-19 declaration under Section 564(b)(1) of the Act, 21 U.S.C. section 360bbb-3(b)(1), unless the authorization is  terminated or revoked sooner.  Performed at [facility], [address], [place], [place] [address]     Chemistries  Recent Labs  Lab 06/04/20 2220  NA 138  K 4.4  CL 105  CO2 27  GLUCOSE 105*  BUN 28*  CREATININE 1.07*  CALCIUM 8.2*  AST 10*  ALT 7  ALKPHOS 74  BILITOT 0.4   ------------------------------------------------------------------------------------------------------------------  ------------------------------------------------------------------------------------------------------------------ GFR: Estimated Creatinine Clearance: 43.5 mL/min (A) (by C-G formula based on SCr of 1.07 mg/dL (H)). Liver Function Tests: Recent Labs  Lab 06/04/20 2220  AST 10*  ALT 7  ALKPHOS 74  BILITOT 0.4  PROT 6.4*  ALBUMIN 2.0*   No results for input(s): LIPASE, AMYLASE in the last 168 hours. No results for input(s): AMMONIA in the last 168 hours. Coagulation Profile: Recent Labs  Lab 06/04/20 2220  INR 1.8*   Cardiac Enzymes: No results for input(s): CKTOTAL, CKMB, CKMBINDEX, TROPONINI in the last 168 hours. BNP (last 3 results) No results for input(s): PROBNP in the last 8760 hours. HbA1C: No results for input(s): HGBA1C in the last 72 hours. CBG: Recent Labs  Lab 06/04/20 2219 06/04/20 2339 06/05/20 0023 06/05/20 0230  GLUCAP 104* 54* 157* 158*   Lipid Profile: No results for input(s): CHOL, HDL, LDLCALC, TRIG, CHOLHDL, LDLDIRECT in the last 72 hours. Thyroid Function Tests: No results for input(s): TSH,  T4TOTAL, FREET4, T3FREE, THYROIDAB in the last 72 hours. Anemia Panel: No results for input(s): VITAMINB12, FOLATE, FERRITIN, TIBC, IRON, RETICCTPCT in the last 72 hours.  --------------------------------------------------------------------------------------------------------------- Urine analysis:    Component Value Date/Time   COLORURINE AMBER (A) 03/25/2020 1515   APPEARANCEUR TURBID (A) 03/25/2020 1515   LABSPEC >1.046 (H) 03/25/2020 1515   PHURINE 5.0 03/25/2020 1515   GLUCOSEU NEGATIVE 03/25/2020 1515   HGBUR LARGE (A) 03/25/2020 1515   BILIRUBINUR NEGATIVE 03/25/2020 1515   KETONESUR 5 (A) 03/25/2020 1515   PROTEINUR 100 (A) 03/25/2020 1515   NITRITE NEGATIVE 03/25/2020 1515   LEUKOCYTESUR MODERATE (A) 03/25/2020 1515      Imaging Results:    DG Chest 2 View  Result Date: 06/04/2020 CLINICAL DATA:  Hypoglycemia. EXAM: CHEST - 2 VIEW COMPARISON:  March 16, 2020 FINDINGS: A right-sided PICC line is noted with its distal tip seen at the junction of the superior vena cava and right atrium. Mild, diffuse chronic appearing increased interstitial lung markings are seen. Marked severity areas of atelectasis and/or infiltrate are seen within the bilateral lung bases, right greater than left. Small bilateral pleural effusions are noted. No pneumothorax is seen. The cardiac silhouette is markedly enlarged and unchanged in size. Degenerative changes seen throughout the thoracic spine. A chronic deformity is seen involving the surgical neck of the proximal right humerus. IMPRESSION: 1. Chronic appearing increased interstitial lung markings with marked severity bibasilar atelectasis and/or infiltrate, right greater than left. 2. Small bilateral pleural effusions. 3. Stable cardiomegaly. Electronically Signed   By: Aram Candela M.D.   On: 06/04/2020 23:11    My personal review of EKG: Rhythm Afib, Rate 67 /min no Acute ST changes   Assessment & Plan:    Active Problems:   Sepsis  (HCC)   1. Sepsis 2/2 pneumonia 1. WBC 12.6, hypothermia 94.8 2. CXR = atelectasis vs infiltrate R>L 3. Cefepime and Vanc started for broad spectrum sepsis coverage 4. Allergic to erythromycin - so no zithromax 5. Start doxycycline and rocephin 6. Blood cultures and sputum culture pending 7. Continue to monitor 2. CAP 1. Continue Rocephin and  doxycycline 2. Covid negative 3. See plan above 3. Hypoglycemia 1. Hold insulin 2. Continue cbg q2H 3. Start D5 if glucose drops again 4. Continue to monitor 4. Elevated BNP in the setting of CHF 1. No previous BNP to compare 2. Today >3000 3. Hx of CHF 4. Last Echo 03/2020 = EF 30-35% 5. In the setting of sepsis and soft Bps, SBP 116 - will not order further lasix 6. Lasix  in ED 7. Continue low sodium diet, daily weights, and strict input and output 5. Chronic anemia 1. At baseline 2. No signs or symptoms of active bleeding 3. Continue to monitor    DVT Prophylaxis-  Eliquis - SCDs   AM Labs Ordered, also please review Full Orders  Family Communication: No family at bedside Code Status:  DNR  Admission status: Inpatient :The appropriate admission status for this patient is INPATIENT. Inpatient status is judged to be reasonable and necessary in order to provide the required intensity of service to ensure the patient's safety. The patient's presenting symptoms, physical exam findings, and initial radiographic and laboratory data in the context of their chronic comorbidities is felt to place them at high risk for further clinical deterioration. Furthermore, it is not anticipated that the patient will be medically stable for discharge from the hospital within 2 midnights of admission. The following factors support the admission status of inpatient.     The patient's presenting symptoms include Hypoglycemia The worrisome physical exam findings include hypothermia The initial radiographic and laboratory data are worrisome because  of CXR shows possible infiltrate and/or atelectasis, and BL pleural effusions The chronic co-morbidities include MI, pressure ulcer, HLD, CAD, Chronic indwelling catheter, colostomy present, CHF, Afib, renal insufficiency       * I certify that at the point of admission it is my clinical judgment that the patient will require inpatient hospital care spanning beyond 2 midnights from the point of admission due to high intensity of service, high risk for further deterioration and high frequency of surveillance required.*  Time spent in minutes : 65   Aadin Gaut B Zierle-Ghosh DO

## 2020-06-05 NOTE — TOC Initial Note (Signed)
Transition of Care [facility]) - Initial/Assessment Note    Patient Details  Name: [name] MRN: [id number] Date of Birth: [birth year]  Transition of Care [facility]) CM/SW Contact:    Sabrina Cassis, LCSW Phone Number: 06/05/2020, 9:55 AM  Clinical Narrative:  Pt admitted due to sepsis secondary to pneumonia. Pt has been a resident at [facility] since April of this year. Per Sabrina Cornfield, RN at facility, pt is okay to return. She states [name], pt's daughter, is listed as HCPOA. LCSW spoke with [name] who confirms plan to return to [facility] when medically stable. TOC will continue to follow.                  Expected Discharge Plan: Skilled Nursing Facility Barriers to Discharge: Continued Medical Work up   Patient Goals and CMS Choice Patient states their goals for this hospitalization and ongoing recovery are:: return to SNF      Expected Discharge Plan and Services Expected Discharge Plan: Skilled Nursing Facility In-house Referral: Clinical Social Work     Living arrangements for the past 2 months: Skilled Nursing Facility                                      Prior Living Arrangements/Services Living arrangements for the past 2 months: Skilled Nursing Facility Lives with:: Facility Resident Patient language and need for interpreter reviewed:: Yes Do you feel safe going back to the place where you live?: Yes      Need for Family Participation in Patient Care: Yes (Comment) Care giver support system in place?: Yes (comment)   Criminal Activity/Legal Involvement Pertinent to Current Situation/Hospitalization: No - Comment as needed  Activities of Daily Living      Permission Sought/Granted         Permission granted to share info w AGENCY: [employer] granted to share info w Relationship: SNF     Emotional Assessment   Attitude/Demeanor/Rapport: Unable to Assess Affect (typically observed): Unable to Assess   Alcohol /  Substance Use: Not Applicable Psych Involvement: No (comment)  Admission diagnosis:  Sepsis [facility]) [A41.9] Patient Active Problem List   Diagnosis Date Noted  . Sepsis (HCC) 06/05/2020  . Cerebral thrombosis with cerebral infarction 03/29/2020  . Pressure injury of skin 03/26/2020  . 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 PAD (peripheral artery disease) (HCC) 03/25/2020  . Peripheral arterial disease (HCC) 03/25/2020   PCP:  [name] Pharmacy:  No Pharmacies Listed    Social Determinants of Health (SDOH) Interventions    Readmission Risk Interventions Readmission Risk Prevention Plan 06/05/2020  Transportation Screening Complete  HRI or Home Care Consult Complete  Social Work Consult for Recovery Care Planning/Counseling Complete  Palliative Care Screening Not Applicable  Medication Review [employer]) Complete

## 2020-06-05 NOTE — Progress Notes (Signed)
The patient is a 74 yr old woman who carries a past medical history significant for history of MI, pressure ulcer,dementia,  HLD, CAD, Chronic indwelling catheter, colostomy present, CHF, Afib, and renal insufficiency. She was sent to the [facility] ED from SNF with complaints of hypoglycemia that did not respond to repeated doses of D 50. When she arrived at the ED she was given more D50 and a meal. Her glucoses seemed to stabilize after that. She is non-verbal except for "yes and no". She wears 2L [place]2 at baseline.    In the ED the patient was found to be agitated. She had a leukocytosis at 12.6 and a CXR that demonstrated Right greater than left infiltrates. She was hypothermic at 94.8 degrees. She was given Cefepime and Vancomycin initially in the ED. She has now been changed over to doxycycline and rocephin. Lactic acid was 1.4. BNP was 3000. She is severely iron deficient.  She was felt to be septic.   [facility] were consulted to admit the patient for further evaluation and treatment. She was admitted by my colleague, Dr. Carren Rang earlier this morning.  She has been roomed in the ED awaiting a bed upstairs. She is resting comfortably. No new complaints. She is saturating 100% on room air. Blood cultures x 2 have been collected, but have had no growth.

## 2020-06-06 ENCOUNTER — Inpatient Hospital Stay (HOSPITAL_COMMUNITY): Payer: Medicare Other

## 2020-06-06 DIAGNOSIS — I5021 Acute systolic (congestive) heart failure: Secondary | ICD-10-CM

## 2020-06-06 DIAGNOSIS — I959 Hypotension, unspecified: Secondary | ICD-10-CM

## 2020-06-06 DIAGNOSIS — E160 Drug-induced hypoglycemia without coma: Secondary | ICD-10-CM

## 2020-06-06 DIAGNOSIS — T383X5A Adverse effect of insulin and oral hypoglycemic [antidiabetic] drugs, initial encounter: Secondary | ICD-10-CM

## 2020-06-06 LAB — ECHOCARDIOGRAM COMPLETE
AR max vel: 2.1 cm2
AV Area VTI: 1.9 cm2
AV Area mean vel: 1.98 cm2
AV Mean grad: 2.9 mmHg
AV Peak grad: 5.7 mmHg
Ao pk vel: 1.19 m/s
Area-P 1/2: 4.12 cm2
Height: 62 in
S' Lateral: 4.85 cm
Weight: 2464 oz

## 2020-06-06 LAB — BRAIN NATRIURETIC PEPTIDE: B Natriuretic Peptide: 2709 pg/mL — ABNORMAL HIGH (ref 0.0–100.0)

## 2020-06-06 LAB — TROPONIN I (HIGH SENSITIVITY): Troponin I (High Sensitivity): 28 ng/L — ABNORMAL HIGH (ref <18)

## 2020-06-06 MED ORDER — COLLAGENASE 250 UNIT/GM EX OINT
TOPICAL_OINTMENT | Freq: Every day | CUTANEOUS | Status: DC
  Filled 2020-06-06: qty 30

## 2020-06-06 MED ORDER — CHLORHEXIDINE GLUCONATE CLOTH 2 % EX PADS: 6.0000 | MEDICATED_PAD | Freq: Every day | CUTANEOUS | Status: DC

## 2020-06-06 MED ORDER — SODIUM CHLORIDE 0.9 % IV BOLUS
500.0000 mL | Freq: Once | INTRAVENOUS | Status: AC
  Administered 2020-06-06: 500 mL via INTRAVENOUS

## 2020-06-06 NOTE — Progress Notes (Signed)
VAST consult to check patency of PICC line. Per order, unable to flush or draw blood back from line. PICC line flushed with 20 mls sterile normal saline, mild resistance initially, but able to flush easily after a few seconds. Ample blood return noted after flush and flushed with 10 mls normal saline to saline lock PICC. Full dressing change done, since unknown last dressing change date (no date noted on dressing). New sterile stat lock, biopatch, and dressing applied after cleansing site with chlorohexadine swab. Patient tolerated procedure well.

## 2020-06-06 NOTE — Progress Notes (Signed)
PROGRESS NOTE  [name] YWV:[id number] DOB: [birth year] DOA: 06/04/2020 PCP: Lindaann Pascal  Brief History   The patient is a 74 yr old woman who carries a past medical history significant for history ofMI, pressure ulcer,dementia,  HLD, CAD, Chronic indwelling catheter, colostomy present, CHF, Afib,andrenal insufficiency. She was sent to the [facility] ED from SNF with complaints of hypoglycemia that did not respond to repeated doses of D 50. When she arrived at the ED she was given more D50 and a meal. Her glucoses seemed to stabilize after that. She is non-verbal except for "yes and no". She wears 2L O2 at baseline.    In the ED the patient was found to be agitated. She had a leukocytosis at 12.6 and a CXR that demonstrated Right greater than left infiltrates. She was hypothermic at 94.8 degrees. She was given Cefepime and Vancomycin initially in the ED. She has now been changed over to doxycycline and rocephin. Lactic acid was 1.4. BNP was 3000. She is severely iron deficient.  She was felt to be septic.   [facility] were consulted to admit the patient for further evaluation and treatment.   Consultants   None  Procedures   None  Antibiotics   Anti-infectives (From admission, onward)   Start     Dose/Rate Route Frequency Ordered Stop   06/05/20 1500  cefTRIAXone (ROCEPHIN) 1 g in sodium chloride 0.9 % 100 mL IVPB        1 g 200 mL/hr over 30 Minutes Intravenous Every 24 hours 06/05/20 0430     06/05/20 1000  doxycycline (VIBRAMYCIN) 100 mg in sodium chloride 0.9 % 250 mL IVPB        100 mg 125 mL/hr over 120 Minutes Intravenous Every 12 hours 06/05/20 0430     06/05/20 0245  ceFEPIme (MAXIPIME) 2 g in sodium chloride 0.9 % 100 mL IVPB        2 g 200 mL/hr over 30 Minutes Intravenous  Once 06/05/20 0241 06/05/20 0359   06/05/20 0245  vancomycin (VANCOREADY) IVPB 1250 mg/250 mL        1,250 mg 166.7 mL/hr over 90 Minutes Intravenous  Once 06/05/20 0242 06/05/20  0725       Subjective  The patient is lethargic and confused this morning.   Objective   Vitals:  Vitals:   06/06/20 0830 06/06/20 1002  BP: (!) 108/53 (!) 88/70  Pulse: 99 (!) 103  Resp:    Temp:  98.8 F (37.1 C)  SpO2: 97% 97%   Exam:  Constitutional:  The patient is lethargic and confused.  Respiratory:   No increased work of breathing.  No wheezes, rales, or rhonchi  No tactile fremitus Cardiovascular:   Regular rate and rhythm  No murmurs, ectopy, or gallups.  No lateral PMI. No thrills. Abdomen:   Abdomen is soft, non-tender, non-distended  No hernias, masses, or organomegaly  Normoactive bowel sounds.  Musculoskeletal:   No cyanosis, clubbing, or edema Skin:   No rashes, lesions, ulcers  palpation of skin: no induration or nodules Neurologic:   CN 2-12 intact  Sensation all 4 extremities intact Psychiatric:   Mental status o Mood, affect appropriate o Orientation to person, place, time   judgment and insight appear intact  I have personally reviewed the following:   Today's Data   Vitals, CMP, CBC, Falate,   Micro Data   Blood culture x 2 (06/04/2020) No growth  Imaging   CXR: Bibasilar infiltrates or atelectasis right greater  than left  Small bilateral pleural effusions  Cardiology Data   EKG 9/20/2021Atrial fibrillation with competing juctional pacer.  Echocardiogram (04/16/2020): Near global hypokinesis. Neaar akinesis in inferior and septal walls. EF is 30-35%. Mildly dilated LV. Grade II diastolic dysfunction. Mildly elevated pulmonary artery systolic pressure. Severely dilated left atrial size.   Scheduled Meds:  apixaban  5 mg Oral BID   famotidine  20 mg Oral Q breakfast   gabapentin  200 mg Oral TID   metoprolol tartrate  12.5 mg Oral Daily   QUEtiapine  50 mg Oral QHS   Continuous Infusions:  cefTRIAXone (ROCEPHIN)  IV Stopped (06/05/20 1906)   doxycycline (VIBRAMYCIN) IV Stopped (06/06/20 0018)     Active Problems:   Sepsis (HCC)   LOS: 1 day   A & P  Sepsis 2/2 pneumonia: WBC 12.6, hypothermia 94.8. CXR = atelectasis vs infiltrate R>L. Cefepime and Vanc started for broad spectrum sepsis coverageAllergic to erythromycin - so no zithromax. Start doxycycline and rocephin. Blood cultures and sputum culture have had no growth. Monitor.   CAP: Continue Rocephin and doxycycline. Covid negative.  CHF: Severe hypokinesis on echo from 04/06/2020. Will repeat study and recheck CXR and BNP. BNP on admission was 3003.0.  Question if hypotension today is due to sepsis vs CHF. She cannot tolerate diuresis given hypotension. Monitor volume status.  Hypoglycemia: One episode of glucose of 54 on the night of 06/04/2020. The patient's glucoses have been normal on no insulin. Continue to monitor.   Chronic anemia with severe iron deficiency anemia. Hemoglobin is stable. No signs or symptoms of active bleeding. Continue to monitor.  I have seen and examined this patient myself. I have spent 38 minutes in her evaluation and care.  DVT Prophylaxis-  Eliquis - SCDs   Family Communication: No family at bedside Code Status:  DNR Admission status: Inpatient  Status is: Inpatient  Remains inpatient appropriate because:Hemodynamically unstable and IV treatments appropriate due to intensity of illness or inability to take PO   Dispo: The patient is from: SNF              Anticipated d/c is to: SNF              Anticipated d/c date is: 3 days              Patient currently is not medically stable to d/c.   Tobi Leinweber, DO [facility] Direct contact: see [email or web address]  7PM-7AM contact night coverage as above 06/06/2020, 12:44 PM  LOS: 1 day

## 2020-06-06 NOTE — Progress Notes (Signed)
*  PRELIMINARY RESULTS* Echocardiogram 2D Echocardiogram has been performed.  [name] 06/06/2020, 4:23 PM

## 2020-06-06 NOTE — Consult Note (Signed)
WOC Nurse Consult Note: Reason for Consult: Consult requested for BLE. Consult performed remotely after review of progress notes and discussed wound appearance with bedside nurse via phone call Wound type: Left heel with stage 3 pressure injury, red and moist, mod amt drainage Left middle foot with unstageable wound, yellow moist wound bed and mod amt drainage Some bilat toes with black dry eschar areas Right heel with stage 3 pressure injury red and moist Pressure Injury POA: Yes Dressing procedure/placement/frequency: Float heels to reduce pressure.Topical treatment orders provided for bedside nurses to perform Q day as follows to provide enzymatic debridement to left middle foot, and absorb drainage to bilat heels, and protect from further injury to bilat toes as follows: Apply xeroform gauze to bilat toe wounds Q day, then cover with dry kerlex Apply Santyl to left middle foot wound Q day, then cover with moist gauze and dry kerlex Apply Aquacel [place] # [phone]) to bilat heel wounds Q day, then cover with dry kerlex.  Moisten with NS to remove previous dressing. Please re-consult if further assistance is needed.  Thank-you,  Cammie Mcgee MSN, RN, CWOCN, [place], CNS [phone]

## 2020-06-07 ENCOUNTER — Encounter (HOSPITAL_COMMUNITY): Payer: Self-pay | Admitting: Family Medicine

## 2020-06-07 ENCOUNTER — Inpatient Hospital Stay (HOSPITAL_COMMUNITY): Payer: Medicare Other

## 2020-06-07 DIAGNOSIS — I739 Peripheral vascular disease, unspecified: Secondary | ICD-10-CM

## 2020-06-07 DIAGNOSIS — Z515 Encounter for palliative care: Secondary | ICD-10-CM

## 2020-06-07 DIAGNOSIS — L03032 Cellulitis of left toe: Secondary | ICD-10-CM

## 2020-06-07 DIAGNOSIS — I471 Supraventricular tachycardia: Secondary | ICD-10-CM

## 2020-06-07 DIAGNOSIS — J189 Pneumonia, unspecified organism: Secondary | ICD-10-CM

## 2020-06-07 DIAGNOSIS — Z7189 Other specified counseling: Secondary | ICD-10-CM

## 2020-06-07 DIAGNOSIS — A419 Sepsis, unspecified organism: Principal | ICD-10-CM

## 2020-06-07 DIAGNOSIS — I429 Cardiomyopathy, unspecified: Secondary | ICD-10-CM

## 2020-06-07 DIAGNOSIS — E162 Hypoglycemia, unspecified: Secondary | ICD-10-CM

## 2020-06-07 DIAGNOSIS — I5023 Acute on chronic systolic (congestive) heart failure: Secondary | ICD-10-CM

## 2020-06-07 DIAGNOSIS — I509 Heart failure, unspecified: Secondary | ICD-10-CM

## 2020-06-07 LAB — TROPONIN I (HIGH SENSITIVITY): Troponin I (High Sensitivity): 29 ng/L — ABNORMAL HIGH (ref <18)

## 2020-06-07 MED ORDER — MORPHINE SULFATE (CONCENTRATE) 10 MG/0.5ML PO SOLN: 5.0000 mg | ORAL | Status: DC | PRN

## 2020-06-07 MED ORDER — MORPHINE SULFATE (PF) 2 MG/ML IV SOLN
1.0000 mg | INTRAVENOUS | Status: DC | PRN
  Administered 2020-06-07 – 2020-06-09 (×2): 1 mg via INTRAVENOUS
  Filled 2020-06-07 (×2): qty 1

## 2020-06-07 MED ORDER — GLYCOPYRROLATE 0.2 MG/ML IJ SOLN
0.2000 mg | INTRAMUSCULAR | Status: DC | PRN
  Administered 2020-06-07: 0.2 mg via INTRAVENOUS
  Filled 2020-06-07: qty 1

## 2020-06-07 MED ORDER — METOPROLOL TARTRATE 5 MG/5ML IV SOLN
INTRAVENOUS | Status: AC
  Administered 2020-06-07: 5 mg via INTRAVENOUS
  Filled 2020-06-07: qty 5

## 2020-06-07 MED ORDER — ADENOSINE 6 MG/2ML IV SOLN
INTRAVENOUS | Status: AC
  Administered 2020-06-07: 6 mg via INTRAVENOUS
  Filled 2020-06-07: qty 2

## 2020-06-07 MED ORDER — ADENOSINE 6 MG/2ML IV SOLN: 12.0000 mg | Freq: Once | INTRAVENOUS | Status: AC

## 2020-06-07 MED ORDER — MORPHINE SULFATE (PF) 2 MG/ML IV SOLN: 2.0000 mg | Freq: Once | INTRAVENOUS | Status: AC

## 2020-06-07 MED ORDER — MORPHINE SULFATE (PF) 2 MG/ML IV SOLN
INTRAVENOUS | Status: AC
  Administered 2020-06-07: 2 mg via INTRAVENOUS
  Filled 2020-06-07: qty 1

## 2020-06-07 MED ORDER — METOPROLOL TARTRATE 25 MG PO TABS: 12.5000 mg | ORAL_TABLET | Freq: Two times a day (BID) | ORAL | Status: DC

## 2020-06-07 MED ORDER — METOPROLOL TARTRATE 5 MG/5ML IV SOLN: 5.0000 mg | Freq: Once | INTRAVENOUS | Status: AC

## 2020-06-07 MED ORDER — ADENOSINE 6 MG/2ML IV SOLN
6.0000 mg | Freq: Once | INTRAVENOUS | Status: AC
  Administered 2020-06-07: 6 mg via INTRAVENOUS

## 2020-06-07 MED ORDER — ADENOSINE 6 MG/2ML IV SOLN: 6.0000 mg | Freq: Once | INTRAVENOUS | Status: AC

## 2020-06-07 MED ORDER — ADENOSINE 6 MG/2ML IV SOLN
INTRAVENOUS | Status: AC
  Administered 2020-06-07: 12 mg via INTRAVENOUS
  Filled 2020-06-07: qty 2

## 2020-06-07 MED ORDER — DIPHENHYDRAMINE HCL 50 MG/ML IJ SOLN: 12.5000 mg | Freq: Four times a day (QID) | INTRAMUSCULAR | Status: DC | PRN

## 2020-06-07 MED ORDER — BISACODYL 10 MG RE SUPP: 10.0000 mg | Freq: Every day | RECTAL | Status: DC | PRN

## 2020-06-07 NOTE — Progress Notes (Signed)
Fidgety, congested.  Given morphine iv and robinul

## 2020-06-07 NOTE — Consult Note (Addendum)
Consultation Note Date: 06/07/2020   Patient Name: [name]  DOB: 04-27-1946  MRN: [id number]  Age / Sex: 74 y.o., female  PCP: [name] Referring Physician: Fran Lowes, DO  Reason for Consultation: Establishing goals of care  HPI/Patient Profile: 74 y.o. female  with past medical history of dementia, MI, CAD, CHF, afib, bilateral non-healing foot wounds, PAD, chronic indwelling catheter, HLD, renal insufficiency admitted on 06/04/2020 with hypoglycemia from SNF. Hospital admission for sepsis secondary to pneumonia/CAP, CHF with EF 25-30%, atrial flutter, hypoglycemia, chronic anemia, chronic bilateral foot wounds. Diuresis has been challenging with soft blood pressures. She has been seen by vascular surgery outpatient and managed for foot wounds with concern for eventually requiring bilateral amputations versus palliative approach to care. Cardiology following. Patient is not a candidate for cath or advanced therapies due to poor functional status and multiple co-morbidities. Cardiology recommending palliative medicine consultation for goals of care.    Clinical Assessment and Goals of Care:  I have reviewed medical records, discussed with care team, and visited patient at bedside. [name] will open eyes to voice but falls right back to sleep. She remains lethargic but without s/s of pain or discomfort. She is currently non-verbal and does not follow commands. No family at bedside.   Spoke with daughter, [name] via telephone. Unfortunately [name] is in quarantine for covid and unable to visit [facility] for in person meeting.   I introduced Palliative Medicine as specialized medical care for people living with serious illness. It focuses on providing relief from the symptoms and stress of a serious illness.   No spouse. Patient has three children, [name] and [name]. [name] lives in [place]. [name] is local and  [name] lives in [place].   [name] describes her mother's health "not well." Her health has been going "downhill" for approximately 2 years with worsening dementia and wounds. Prior to this admission, living at [facility] and dependent of all ADL's. No longer recognizes family.   Discussed events leading up to admission and course of hospitalization including diagnoses, interventions, plan of care. [name] is appreciative of conversations with providers including Dr. Gerri Lins.   I attempted to elicit values and goals of care important to [name]. She is tearful during discussion. She states "I don't want her to suffer anymore" and that she has suffered enough. [name] has spoken with sister [name] and brother [name] and the children agree on goals for comfort moving forward, understanding poor prognosis. [name] states "let her be" and focus on comfort. She again emphasizes that she does not wish to see her mother suffer any longer.   The difference between aggressive medical intervention and comfort care was discussed. Explained transition to comfort measures only, emphasizing focus on symptom management, allowing nature to take course, and ensuring she does not suffer. Discussed symptom management medications. Discussed discontinuation of interventions not aimed at comfort. Discussed comfort feeds and unrestricted visitor access. [name] agrees with plan for transition to comfort measures only.   Briefly discussed hospice options but [name] and I  plan to see how her mother does this evening and further discuss options in AM.   Questions and concerns were addressed. Emotional support provided.     SUMMARY OF RECOMMENDATIONS    GOC discussion with daughter, Sabrina HesselbachMaria. She has spoken with her siblings and family consensus for transition to comfort measures. They do not wish to see her suffer any longer. Family understands poor prognosis.   Transition to comfort measures only. Discontinue interventions not  aimed at comfort.   Symptom management--see below  Comfort feeds per patient/family request.  Unrestricted visitor access.  Wound care as needed.  Will f/u with daughter in AM to discuss hospice options if appropriate. Updated Dr. Gerri LinsSwayze, Dr. Wyline MoodBranch, and RN.   Code Status/Advance Care Planning:  DNR  Symptom Management:   Morphine 1mg  IV q3h prn severe pain/dyspnea/air hunger/tachypnea  Roxanol 5mg  SL q3h prn moderate pain/dyspnea/air hunger/tachypnea  Robinul 0.2mg  IV q4h prn secretions  Ativan 0.5mg  IV q6h prn anxiety  Dulcolax daily prn moderate constipation  Benadryl 12.5mg  IV q6h prn itching  Palliative Prophylaxis:   Aspiration, Bowel Regimen, Delirium Protocol, Frequent Pain Assessment, Oral Care, Palliative Wound Care and Turn Reposition  Additional Recommendations (Limitations, Scope, Preferences):  Full Comfort Care  Psycho-social/Spiritual:   Desire for further Chaplaincy support: yes  Additional Recommendations: Caregiving  Support/Resources, Compassionate Wean Education and Education on Hospice  Prognosis:   Poor prognosis: weeks if not less   Discharge Planning: To Be Determined: will further discuss hospice options with daughter in AM      Primary Diagnoses: Present on Admission: . Sepsis (HCC) . Pressure injury of skin . 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 PAD (peripheral artery disease) (HCC)   I have reviewed the medical record, interviewed the patient and family, and examined the patient. The following aspects are pertinent.  Past Medical History:  Diagnosis Date  . A-fib (HCC)   . Anemia   . CHF (congestive heart failure) (HCC)   . Chronic kidney disease   . Colostomy present (HCC)   . Dementia (HCC)   . Diabetes mellitus without complication (HCC)   . Foley catheter in place   . Heart disease   . Hyperlipidemia   . Myocardial infarction (HCC)   . Pressure ulcer    Social History    Socioeconomic History  . Marital status: Unknown    Spouse name: Not on file  . Number of children: Not on file  . Years of education: Not on file  . Highest education level: Not on file  Occupational History  . Not on file  Tobacco Use  . Smoking status: Unknown If Ever Smoked  . Smokeless tobacco: Never Used  Substance and Sexual Activity  . Alcohol use: Not Currently  . Drug use: Not Currently  . Sexual activity: Not Currently  Other Topics Concern  . Not on file  Social History Narrative  . Not on file   Social Determinants of Health   Financial Resource Strain:   . Difficulty of Paying Living Expenses: Not on file  Food Insecurity:   . Worried About Programme researcher, broadcasting/film/videounning Out of Food in the Last Year: Not on file  . Ran Out of Food in the Last Year: Not on file  Transportation Needs:   . Lack of Transportation (Medical): Not on file  . Lack of Transportation (Non-Medical): Not on file  Physical Activity:   . Days of Exercise per Week: Not on file  . Minutes of Exercise per Session: Not  on file  Stress:   . Feeling of Stress : Not on file  Social Connections:   . Frequency of Communication with Friends and Family: Not on file  . Frequency of Social Gatherings with Friends and Family: Not on file  . Attends Religious Services: Not on file  . Active Member of Clubs or Organizations: Not on file  . Attends Banker Meetings: Not on file  . Marital Status: Not on file   History reviewed. No pertinent family history. Scheduled Meds: . collagenase   Topical Daily  . QUEtiapine  50 mg Oral QHS   Continuous Infusions:  PRN Meds:.acetaminophen **OR** acetaminophen, bisacodyl, diphenhydrAMINE, glycopyrrolate, LORazepam, morphine injection, morphine CONCENTRATE, oxyCODONE, polyethylene glycol Medications Prior to Admission:  Prior to Admission medications   Medication Sig Start Date End Date Taking? Authorizing Provider  acetaminophen (TYLENOL) 500 MG tablet Take 1,000  mg by mouth in the morning and at bedtime.   Yes [provider]  Amino Acids-Protein Hydrolys (FEEDING SUPPLEMENT, PRO-STAT SUGAR FREE 64,) LIQD Take 30 mLs by mouth daily with breakfast.   Yes [provider]  apixaban (ELIQUIS) 5 MG TABS tablet Take 5 mg by mouth 2 (two) times daily.    Yes [provider]  Ascorbic Acid (VITAMIN C) 500 MG CHEW Chew 500 mg by mouth in the morning and at bedtime.    Yes [provider]  aspirin EC 81 MG tablet Take 81 mg by mouth daily.   Yes [provider]  Cholecalciferol (VITAMIN D) 50 MCG (2000 UT) tablet Take 2,000 Units by mouth daily.    Yes [provider]  famotidine (PEPCID) 20 MG tablet Take 20 mg by mouth daily with breakfast.    Yes [provider]  furosemide (LASIX) 20 MG tablet Take 20 mg by mouth at bedtime. For 3 days for edema   Yes [provider]  gabapentin (NEURONTIN) 100 MG capsule Take 200 mg by mouth 3 (three) times daily.   Yes [provider]  Heparin Lock Flush (HEPARIN FLUSH) 10 UNIT/ML SOLN injection Inject 10 Units into the vein in the morning and at bedtime. for 7days 06/03/20  Yes [provider]  HYDROcodone-acetaminophen (NORCO/VICODIN) 5-325 MG tablet Take 1 tablet by mouth every 6 (six) hours.   Yes [provider]  insulin detemir (LEVEMIR) 100 UNIT/ML injection Inject 14 Units into the skin daily.    Yes [provider]  insulin lispro (HUMALOG KWIKPEN) 100 UNIT/ML KwikPen Inject 0-10 Units into the skin 3 (three) times daily with meals. Dose per sliding scale. 0-150 = 0 units; 151-200 = 2 units; 201-250 = 4 units; 251-300 = 6 units; 301-350 = 8 units; 351-400 = 10 units; >400, notify MD.   Yes [provider]  Multiple Vitamin (MULTIVITAMIN) capsule Take 1 capsule by mouth daily.   Yes [provider]  polyethylene glycol (MIRALAX / GLYCOLAX) 17 g packet Take 17 g by mouth daily as needed  (constipation).    Yes [provider]  QUEtiapine (SEROQUEL) 50 MG tablet Take 50 mg by mouth at bedtime.   Yes [provider]  sertraline (ZOLOFT) 25 MG tablet Take 25 mg by mouth daily.   Yes [provider]  tamsulosin (FLOMAX) 0.4 MG CAPS capsule Take 0.4 mg by mouth at bedtime.    Yes [provider]  vancomycin IVPB Inject 500 mg into the vein every 12 (twelve) hours. 06/03/20 06/09/20 Yes [provider]  zinc sulfate 220 (  50 Zn) MG capsule Take 220 mg by mouth daily.   Yes [provider]  LORazepam (ATIVAN) 0.5 MG tablet Take 0.5 mg by mouth every 6 (six) hours as needed for anxiety (agitation). Patient not taking: Reported on 06/05/2020    [provider]  metoprolol tartrate (LOPRESSOR) 25 MG tablet Take 12.5 mg by mouth daily. Patient not taking: Reported on 06/05/2020    [provider]   Allergies  Allergen Reactions  . Codeine   . Erythromycin   . Sulfa Antibiotics    Review of Systems  Unable to perform ROS: Acuity of condition   Physical Exam Vitals and nursing note reviewed.  Constitutional:      Appearance: She is ill-appearing.  Pulmonary:     Effort: No tachypnea, accessory muscle usage or respiratory distress.  Skin:    General: Skin is warm and dry.     Comments: Bilateral foot wounds. Dressings C/D/I  Neurological:     Mental Status: She is lethargic.     Comments: Opens eyes to voice, drowsy, not following commands. Nonverbal    Vital Signs: BP (!) 108/49 (BP Location: Left Arm)   Pulse 85   Temp 98.7 F (37.1 C)   Resp 20   Ht 5\' 2"  (1.575 m)   Wt 67.5 kg   SpO2 100%   BMI 27.22 kg/m  Pain Scale: PAINAD       SpO2: SpO2: 100 % O2 Device:SpO2: 100 % O2 Flow Rate: .O2 Flow Rate (L/min): 4 L/min  IO: Intake/output summary:   Intake/Output Summary (Last 24 hours) at 06/07/2020 1345 Last data filed at 06/07/2020 0800 Gross per 24 hour  Intake 0 ml  Output 200 ml  Net  -200 ml    LBM: Last BM Date: 06/07/20 Baseline Weight: Weight: 69.9 kg Most recent weight: Weight: 67.5 kg     Palliative Assessment/Data: PPS 20%   Flowsheet Rows     Most Recent Value  Intake Tab  Referral Department Hospitalist  Unit at Time of Referral Med/Surg Unit  Palliative Care Primary Diagnosis Other (Comment)  Palliative Care Type New Palliative care  Reason for referral Clarify Goals of Care  Date first seen by Palliative Care 06/07/20  Clinical Assessment  Palliative Performance Scale Score 20%  Psychosocial & Spiritual Assessment  Palliative Care Outcomes  Patient/Family meeting held? Yes  Who was at the meeting? daughter 06/09/20)  Palliative Care Outcomes Improved pain interventions, Improved non-pain symptom therapy, Clarified goals of care, Counseled regarding hospice, Provided end of life care assistance, Provided psychosocial or spiritual support, Changed to focus on comfort, Transitioned to hospice, ACP counseling assistance      Time In: 1300 Time Out: 1345 Time Total: 45 Greater than 50%  of this time was spent counseling and coordinating care related to the above assessment and plan.  Signed by:  Sabrina Hesselbach, DNP, FNP-C Palliative Medicine Team  Phone: [phone] Fax: [phone]   Please contact Palliative Medicine Team phone at [phone] for questions and concerns.  For individual provider: See [phone]

## 2020-06-07 NOTE — Consult Note (Addendum)
Cardiology Consultation:   Patient ID: [name] MRN: [id number]; DOB: 1945-10-21  Admit date: 06/04/2020 Date of Consult: 06/07/2020  Primary Care Provider: Lindaann Pascal [facility] Cardiologist: No primary care provider on file. new Dr. [facility] Electrophysiologist:  None    Patient Profile:   [name] is a 74 y.o. female with a hx of dementia, CVA, CAD, PAF, DM-2, HLD, hx of MI PAD and non healing wound bil.,  Colostomy, chronic anemia  who is being seen today for the evaluation of A fib RVR at the request of Dr. Gerri Lins.  History of Present Illness:   [name] hx as above and hx of nonhealing wounds to both feet and multilevel arterial occlusive disease by aortogram and per Dr. Edilia Bo with vascular pt not a candidate for bypass or endovascular approach.  If wound progressed the options would be amputations bilaterally vs. palliative care..   She does have hx per Dr. Adele Dan note after he reviewed old records of CAD, Hx MI and PAF.  She has chronic indwelling foley catheter. nwith admit at [facility] for PAD she did have episode of chest pain and Echo was done with decreased EF.troponin pk of 79- she did well after that one episode.  Presented from SNF for hypoglycemia, this was treated, and pt is mostly non verbal does answer yes and no.  On 2L [place] of 02 at baseline. Placed on ABX for wounds. Admitted with sepsis due to PNA.     EKG:  The EKG was personally reviewed and demonstrates:  SR to SB with PAC vs a fib - then during the night SVT at 155 with LVH and lat ST depresion due to rate.    Telemetry:  Telemetry was personally reviewed and demonstrates:  Appears to be SR with PACs --review of tele overnight HR was 126 possible a flutter then up to 155.    Na 138, K+ 3.8 Cr. 1.03  BNP 2709 Hs troponin 28,29 Hgb 7.2, WBC 12 plts 281  Echo with EF 25-30% mild LVH, global hypokinesis, LA severely dilated, RV mod enlarged, RV systolic low normal.   Pericardial effusion is small to moderate. Mild MR  (echo from 03/2020 with EF 30-35%G2DD; RV was normal. + circumferential effusion no tamponade with moderate pericardial affusion).   During the night pt was in possible a flutter vs ST of 126 then to 155, adenosine 6 mg given x 2 and then 12 mg given and rate slowed.     BP 126/109 to 108/49 - pt without responsive, appears awake with congested cough.   Past Medical History:  Diagnosis Date  . A-fib (HCC)   . Anemia   . CHF (congestive heart failure) (HCC)   . Chronic kidney disease   . Colostomy present (HCC)   . Dementia (HCC)   . Diabetes mellitus without complication (HCC)   . Foley catheter in place   . Heart disease   . Hyperlipidemia   . Myocardial infarction (HCC)   . Pressure ulcer     Past Surgical History:  Procedure Laterality Date  . ABDOMINAL AORTOGRAM W/LOWER EXTREMITY Bilateral 03/25/2020   Procedure: ABDOMINAL AORTOGRAM W/LOWER EXTREMITY;  Surgeon: Chuck Hint, MD;  Location: [facility] INVASIVE CV LAB;  Service: Cardiovascular;  Laterality: Bilateral;  . COLOSTOMY       Home Medications:  Prior to Admission medications   Medication Sig Start Date End Date Taking? Authorizing Provider  acetaminophen (TYLENOL) 500 MG tablet Take 1,000 mg by mouth in the  morning and at bedtime.   Yes [provider]  Amino Acids-Protein Hydrolys (FEEDING SUPPLEMENT, PRO-STAT SUGAR FREE 64,) LIQD Take 30 mLs by mouth daily with breakfast.   Yes [provider]  apixaban (ELIQUIS) 5 MG TABS tablet Take 5 mg by mouth 2 (two) times daily.    Yes [provider]  Ascorbic Acid (VITAMIN C) 500 MG CHEW Chew 500 mg by mouth in the morning and at bedtime.    Yes [provider]  aspirin EC 81 MG tablet Take 81 mg by mouth daily.   Yes [provider]  Cholecalciferol (VITAMIN D) 50 MCG (2000 UT) tablet Take 2,000 Units by mouth daily.    Yes [provider]  famotidine (PEPCID) 20 MG  tablet Take 20 mg by mouth daily with breakfast.    Yes [provider]  furosemide (LASIX) 20 MG tablet Take 20 mg by mouth at bedtime. For 3 days for edema   Yes [provider]  gabapentin (NEURONTIN) 100 MG capsule Take 200 mg by mouth 3 (three) times daily.   Yes [provider]  Heparin Lock Flush (HEPARIN FLUSH) 10 UNIT/ML SOLN injection Inject 10 Units into the vein in the morning and at bedtime. for 7days 06/03/20  Yes [provider]  HYDROcodone-acetaminophen (NORCO/VICODIN) 5-325 MG tablet Take 1 tablet by mouth every 6 (six) hours.   Yes [provider]  insulin detemir (LEVEMIR) 100 UNIT/ML injection Inject 14 Units into the skin daily.    Yes [provider]  insulin lispro (HUMALOG KWIKPEN) 100 UNIT/ML KwikPen Inject 0-10 Units into the skin 3 (three) times daily with meals. Dose per sliding scale. 0-150 = 0 units; 151-200 = 2 units; 201-250 = 4 units; 251-300 = 6 units; 301-350 = 8 units; 351-400 = 10 units; >400, notify MD.   Yes [provider]  Multiple Vitamin (MULTIVITAMIN) capsule Take 1 capsule by mouth daily.   Yes [provider]  polyethylene glycol (MIRALAX / GLYCOLAX) 17 g packet Take 17 g by mouth daily as needed (constipation).    Yes [provider]  QUEtiapine (SEROQUEL) 50 MG tablet Take 50 mg by mouth at bedtime.   Yes [provider]  sertraline (ZOLOFT) 25 MG tablet Take 25 mg by mouth daily.   Yes [provider]  tamsulosin (FLOMAX) 0.4 MG CAPS capsule Take 0.4 mg by mouth at bedtime.    Yes [provider]  vancomycin IVPB Inject 500 mg into the vein every 12 (twelve) hours. 06/03/20 06/09/20 Yes [provider]  zinc sulfate 220 (50 Zn) MG capsule Take 220 mg by mouth daily.   Yes [provider]  LORazepam (ATIVAN) 0.5 MG tablet Take 0.5 mg by mouth every 6 (six) hours as needed for anxiety (agitation). Patient not taking: Reported on  06/05/2020    [provider]  metoprolol tartrate (LOPRESSOR) 25 MG tablet Take 12.5 mg by mouth daily. Patient not taking: Reported on 06/05/2020    [provider]    Inpatient Medications: Scheduled Meds: . apixaban  5 mg Oral BID  . Chlorhexidine Gluconate Cloth  6 each Topical Daily  . collagenase   Topical Daily  . famotidine  20 mg Oral Q breakfast  . gabapentin  200 mg Oral TID  . metoprolol tartrate  12.5 mg Oral BID  . QUEtiapine  50 mg Oral QHS   Continuous Infusions: . cefTRIAXone (ROCEPHIN)  IV 1 g (06/06/20 1822)  . doxycycline (VIBRAMYCIN) IV  100 mg (06/07/20 0017)   PRN Meds: acetaminophen **OR** acetaminophen, LORazepam, oxyCODONE, polyethylene glycol  Allergies:    Allergies  Allergen Reactions  . Codeine   . Erythromycin   . Sulfa Antibiotics     Social History:   Social History   Socioeconomic History  . Marital status: Unknown    Spouse name: Not on file  . Number of children: Not on file  . Years of education: Not on file  . Highest education level: Not on file  Occupational History  . Not on file  Tobacco Use  . Smoking status: Unknown If Ever Smoked  . Smokeless tobacco: Never Used  Substance and Sexual Activity  . Alcohol use: Not Currently  . Drug use: Not Currently  . Sexual activity: Not Currently  Other Topics Concern  . Not on file  Social History Narrative  . Not on file   Social Determinants of Health   Financial Resource Strain:   . Difficulty of Paying Living Expenses: Not on file  Food Insecurity:   . Worried About Programme researcher, broadcasting/film/video in the Last Year: Not on file  . Ran Out of Food in the Last Year: Not on file  Transportation Needs:   . Lack of Transportation (Medical): Not on file  . Lack of Transportation (Non-Medical): Not on file  Physical Activity:   . Days of Exercise per Week: Not on file  . Minutes of Exercise per Session: Not on file  Stress:   . Feeling of Stress : Not on file  Social  Connections:   . Frequency of Communication with Friends and Family: Not on file  . Frequency of Social Gatherings with Friends and Family: Not on file  . Attends Religious Services: Not on file  . Active Member of Clubs or Organizations: Not on file  . Attends Banker Meetings: Not on file  . Marital Status: Not on file  Intimate Partner Violence:   . Fear of Current or Ex-Partner: Not on file  . Emotionally Abused: Not on file  . Physically Abused: Not on file  . Sexually Abused: Not on file    Family History:   History reviewed. No pertinent family history. pt only gives yes and no answers and today no answers.     ROS:  Please see the history of present illness.  From chart General:+ colds no fevers, no weight changes Skin:no rashes + ulcers bilateral lower ext uslcers mostly feet wrapped HEENT:no blurred vision, no congestion CV:see HPI PUL:see HPI GI:no diarrhea--has colostomy bag, no constipation or melena, no indigestion GU:no hematuria, no dysuria- has chronic foley MS:no joint pain, no claudication- that I can tell Neuro:no syncope, no lightheadedness- pt in bed, + dementia non verbal Endo:no diabetes, no thyroid disease  All other ROS reviewed and negative.     Physical Exam/Data:   Vitals:   06/06/20 1500 06/06/20 2350 06/07/20 0500 06/07/20 0557  BP: 111/60 (!) 126/109  (!) 108/49  Pulse: 93 (!) 109  85  Resp:  20  20  Temp: 97.9 F (36.6 C)   98.7 F (37.1 C)  TempSrc: Axillary     SpO2: 97% 90%  100%  Weight:   67.5 kg   Height:        Intake/Output Summary (Last 24 hours) at 06/07/2020 0916 Last data filed at 06/06/2020 1708 Gross per 24 hour  Intake --  Output 200 ml  Net -200 ml   Last 3 Weights 06/07/2020 06/04/2020 03/25/2020  Weight (lbs) 148 lb 13 oz 154 lb 143 lb  Weight (kg) 67.5 kg 69.854 kg 64.864 kg     Body mass index is 27.22 kg/m.  General:  Frail female, appears to have contractures.  Ulcers on feet wrapped HEENT:  normal Lymph: no adenopathy Neck: no JVD Endocrine:  No thryomegaly Vascular: No carotid bruits; unable to palpate pedal pulses, hx of PAD  Cardiac:  normal S1, S2; RRR; no murmur gallup rub or click Lungs:  congetsed to auscultation bilaterally, no wheezing, ++rhonchi no rales  Abd: soft, nontender, no hepatomegaly +colostomy in place with liquid braown stool Ext: no edema Musculoskeletal:  +contractures, does not follow commands Skin: cool and dry  Neuro:  Awake, does not respond to voice or pain , no focal abnormalities noted Psych:  flat affect    Relevant CV Studies: TTE  06/06/20  IMPRESSIONS    1. Left ventricular ejection fraction, by estimation, is 25 to 30%. The  left ventricle has severely decreased function. The left ventricle  demonstrates global hypokinesis. There is mild left ventricular  hypertrophy. Left ventricular diastolic parameters  are indeterminate.  2. Right ventricular systolic function is low normal. The right  ventricular size is moderately enlarged.  3. Left atrial size was severely dilated.  4. Small to moderate. There is no evidence of cardiac tamponade.  5. The mitral valve is normal in structure. Mild mitral valve  regurgitation. No evidence of mitral stenosis.  6. The aortic valve is tricuspid. Aortic valve regurgitation is not  visualized. No aortic stenosis is present.  7. The inferior vena cava is normal in size with greater than 50%  respiratory variability, suggesting right atrial pressure of 3 mmHg.   FINDINGS  Left Ventricle: Left ventricular ejection fraction, by estimation, is 25  to 30%. The left ventricle has severely decreased function. The left  ventricle demonstrates global hypokinesis. The left ventricular internal  cavity size was normal in size. There  is mild left ventricular hypertrophy. Left ventricular diastolic  parameters are indeterminate.   Right Ventricle: The right ventricular size is moderately  enlarged. No  increase in right ventricular wall thickness. Right ventricular systolic  function is low normal.   Left Atrium: Left atrial size was severely dilated.   Right Atrium: Right atrial size was normal in size.   Pericardium: Small to moderate circumferential pericardial effusion. There  is some RA indentation without collapse. There is no RV collapse. Normal  IVC. Small to moderate. There is no evidence of cardiac tamponade.   Mitral Valve: The mitral valve is normal in structure. Mild mitral valve  regurgitation. No evidence of mitral valve stenosis.   Tricuspid Valve: The tricuspid valve is normal in structure. Tricuspid  valve regurgitation is trivial. No evidence of tricuspid stenosis.   Aortic Valve: The aortic valve is tricuspid. Aortic valve regurgitation is  not visualized. No aortic stenosis is present. Aortic valve mean gradient  measures 2.9 mmHg. Aortic valve peak gradient measures 5.7 mmHg. Aortic  valve area, by VTI measures 1.90  cm.   Pulmonic Valve: The pulmonic valve was not well visualized. Pulmonic valve  regurgitation is not visualized. No evidence of pulmonic stenosis.   Aorta: The aortic root is normal in size and structure.   Pulmonary Artery: Indeterminant PASP, inadequate TR jet.   Venous: The inferior vena cava is normal in size with greater than 50%  respiratory variability, suggesting right atrial pressure of 3 mmHg.   IAS/Shunts: No atrial level shunt detected by  color flow Doppler.    TTE 03/29/20 IMPRESSIONS    1. Multiple areas of hypokinesis, near global. Inferior and septal walls  with near akinesis. Anterolateral wall thickens the best but is still  mildly hypokinetic.Marland Kitchen Left ventricular ejection fraction, by estimation, is  30 to 35%. The left ventricle has  moderately decreased function. The left ventricle demonstrates global  hypokinesis. The left ventricular internal cavity size was mildly dilated.  Left ventricular  diastolic parameters are consistent with Grade II  diastolic dysfunction  (pseudonormalization).  2. Right ventricular systolic function is normal. The right ventricular  size is normal. There is mildly elevated pulmonary artery systolic  pressure.  3. Left atrial size was severely dilated.  4. Circumferential effusion, max diameter 1.9 cm. There is RA diastolic  collapse, but no clear RV collapse. MV/TV inflow velocities do not suggest  tamponade. Recommend clinical correlation.. Moderate pericardial effusion.  The pericardial effusion is  circumferential.  5. The mitral valve is normal in structure. Mild mitral valve  regurgitation. No evidence of mitral stenosis.  6. The aortic valve is tricuspid. Aortic valve regurgitation is not  visualized. No aortic stenosis is present.  7. The inferior vena cava is normal in size with <50% respiratory  variability, suggesting right atrial pressure of 8 mmHg.   Comparison(s): No prior Echocardiogram.   Conclusion(s)/Recommendation(s): No intracardiac source of embolism  detected on this transthoracic study. A transesophageal echocardiogram is  recommended to exclude cardiac source of embolism if clinically indicated.   FINDINGS  Left Ventricle: Multiple areas of hypokinesis, near global. Inferior and  septal walls with near akinesis. Anterolateral wall thickens the best but  is still mildly hypokinetic. Left ventricular ejection fraction, by  estimation, is 30 to 35%. The left  ventricle has moderately decreased function. The left ventricle  demonstrates global hypokinesis. The left ventricular internal cavity size  was mildly dilated. There is borderline concentric left ventricular  hypertrophy. Left ventricular diastolic  parameters are consistent with Grade II diastolic dysfunction  (pseudonormalization).   Right Ventricle: The right ventricular size is normal. No increase in  right ventricular wall thickness. Right ventricular  systolic function is  normal. There is mildly elevated pulmonary artery systolic pressure. The  tricuspid regurgitant velocity is 2.78  m/s, and with an assumed right atrial pressure of 8 mmHg, the estimated  right ventricular systolic pressure is 38.9 mmHg.   Left Atrium: Left atrial size was severely dilated.   Right Atrium: Right atrial size was normal in size.   Pericardium: Circumferential effusion, max diameter 1.9 cm. There is RA  diastolic collapse, but no clear RV collapse. MV/TV inflow velocities do  not suggest tamponade. Recommend clinical correlation. A moderately sized  pericardial effusion is present.  The pericardial effusion is circumferential. There is diastolic collapse  of the right atrial wall.   Mitral Valve: The mitral valve is normal in structure. Mild mitral valve  regurgitation. No evidence of mitral valve stenosis.   Tricuspid Valve: The tricuspid valve is normal in structure. Tricuspid  valve regurgitation is trivial. No evidence of tricuspid stenosis.   Aortic Valve: The aortic valve is tricuspid. Aortic valve regurgitation is  not visualized. No aortic stenosis is present.   Pulmonic Valve: The pulmonic valve was grossly normal. Pulmonic valve  regurgitation is trivial. No evidence of pulmonic stenosis.   Aorta: The aortic root, ascending aorta and aortic arch are all  structurally normal, with no evidence of dilitation or obstruction.   Venous: The inferior vena cava  is normal in size with less than 50%  respiratory variability, suggesting right atrial pressure of 8 mmHg.   IAS/Shunts: The atrial septum is grossly normal.   Additional Comments: There is a small pleural effusion in both left and  right lateral regions.   Laboratory Data:  High Sensitivity Troponin:   Recent Labs  Lab 06/06/20 1728 06/06/20 2355  TROPONINIHS 28* 29*     Chemistry Recent Labs  Lab 06/04/20 2220 06/05/20 0503  NA 138 138  K 4.4 3.8  CL 105 107  CO2  27 23  GLUCOSE 105* 122*  BUN 28* 28*  CREATININE 1.07* 1.03*  CALCIUM 8.2* 7.8*  GFRNONAA 52* 54*  GFRAA >60 >60  ANIONGAP 6 8    Recent Labs  Lab 06/04/20 2220 06/05/20 0503  PROT 6.4* 5.5*  ALBUMIN 2.0* 1.7*  AST 10* 9*  ALT 7 9  ALKPHOS 74 61  BILITOT 0.4 0.6   Hematology Recent Labs  Lab 06/04/20 2220 06/05/20 0503 06/05/20 0921  WBC 12.6* 12.0*  --   RBC 3.63* 3.02* 3.47*  HGB 8.5* 7.2*  --   HCT 29.4* 24.4*  --   MCV 81.0 80.8  --   MCH 23.4* 23.8*  --   MCHC 28.9* 29.5*  --   RDW 22.4* 22.3*  --   PLT 302 281  --    BNP Recent Labs  Lab 06/04/20 2220 06/06/20 1728  BNP 3,003.0* 2,709.0*    DDimer No results for input(s): DDIMER in the last 168 hours.   Radiology/Studies:  DG Chest 2 View  Result Date: 06/04/2020 CLINICAL DATA:  Hypoglycemia. EXAM: CHEST - 2 VIEW COMPARISON:  March 16, 2020 FINDINGS: A right-sided PICC line is noted with its distal tip seen at the junction of the superior vena cava and right atrium. Mild, diffuse chronic appearing increased interstitial lung markings are seen. Marked severity areas of atelectasis and/or infiltrate are seen within the bilateral lung bases, right greater than left. Small bilateral pleural effusions are noted. No pneumothorax is seen. The cardiac silhouette is markedly enlarged and unchanged in size. Degenerative changes seen throughout the thoracic spine. A chronic deformity is seen involving the surgical neck of the proximal right humerus. IMPRESSION: 1. Chronic appearing increased interstitial lung markings with marked severity bibasilar atelectasis and/or infiltrate, right greater than left. 2. Small bilateral pleural effusions. 3. Stable cardiomegaly. Electronically Signed   By: Aram Candela M.D.   On: 06/04/2020 23:11   ECHOCARDIOGRAM COMPLETE  Result Date: 06/06/2020    ECHOCARDIOGRAM REPORT   Patient Name:   YANEL DOMBROSKY Date of Exam: 06/06/2020 Medical Rec #:  [id number]        Height:        62.0 in Accession #:    [id number]       Weight:       154.0 lb Date of Birth:  09/16/1946       BSA:          1.711 m Patient Age:    73 years         BP:           111/60 mmHg Patient Gender: F                HR:           93 bpm. [facility] Procedure: 2D Echo Indications:    CHF-Acute Systolic 428.21 / I50.21  History:        Patient has  prior history of Echocardiogram examinations, most                 recent 03/29/2020. CHF, Previous Myocardial Infarction,                 Arrythmias:Atrial Fibrillation; Risk Factors:Diabetes and                 Dyslipidemia.  Sonographer:    Jeryl Columbia RDCS (AE) Referring Phys: [id number] AVA SWAYZE IMPRESSIONS  1. Left ventricular ejection fraction, by estimation, is 25 to 30%. The left ventricle has severely decreased function. The left ventricle demonstrates global hypokinesis. There is mild left ventricular hypertrophy. Left ventricular diastolic parameters  are indeterminate.  2. Right ventricular systolic function is low normal. The right ventricular size is moderately enlarged.  3. Left atrial size was severely dilated.  4. Small to moderate. There is no evidence of cardiac tamponade.  5. The mitral valve is normal in structure. Mild mitral valve regurgitation. No evidence of mitral stenosis.  6. The aortic valve is tricuspid. Aortic valve regurgitation is not visualized. No aortic stenosis is present.  7. The inferior vena cava is normal in size with greater than 50% respiratory variability, suggesting right atrial pressure of 3 mmHg. FINDINGS  Left Ventricle: Left ventricular ejection fraction, by estimation, is 25 to 30%. The left ventricle has severely decreased function. The left ventricle demonstrates global hypokinesis. The left ventricular internal cavity size was normal in size. There is mild left ventricular hypertrophy. Left ventricular diastolic parameters are indeterminate. Right Ventricle: The right ventricular size is moderately enlarged. No  increase in right ventricular wall thickness. Right ventricular systolic function is low normal. Left Atrium: Left atrial size was severely dilated. Right Atrium: Right atrial size was normal in size. Pericardium: Small to moderate circumferential pericardial effusion. There is some RA indentation without collapse. There is no RV collapse. Normal IVC. Small to moderate. There is no evidence of cardiac tamponade. Mitral Valve: The mitral valve is normal in structure. Mild mitral valve regurgitation. No evidence of mitral valve stenosis. Tricuspid Valve: The tricuspid valve is normal in structure. Tricuspid valve regurgitation is trivial. No evidence of tricuspid stenosis. Aortic Valve: The aortic valve is tricuspid. Aortic valve regurgitation is not visualized. No aortic stenosis is present. Aortic valve mean gradient measures 2.9 mmHg. Aortic valve peak gradient measures 5.7 mmHg. Aortic valve area, by VTI measures 1.90 cm. Pulmonic Valve: The pulmonic valve was not well visualized. Pulmonic valve regurgitation is not visualized. No evidence of pulmonic stenosis. Aorta: The aortic root is normal in size and structure. Pulmonary Artery: Indeterminant PASP, inadequate TR jet. Venous: The inferior vena cava is normal in size with greater than 50% respiratory variability, suggesting right atrial pressure of 3 mmHg. IAS/Shunts: No atrial level shunt detected by color flow Doppler.  LEFT VENTRICLE PLAX 2D LVIDd:         5.28 cm  Diastology LVIDs:         4.85 cm  LV e' lateral:   9.68 cm/s LV PW:         1.28 cm  LV E/e' lateral: 6.2 LV IVS:        1.04 cm LVOT diam:     2.00 cm LV SV:         45 LV SV Index:   26 LVOT Area:     3.14 cm  RIGHT VENTRICLE RV S prime:     6.83 cm/s TAPSE (M-mode): 1.6 cm LEFT ATRIUM  Index       RIGHT ATRIUM           Index LA diam:        4.40 cm 2.57 cm/m  RA Area:     10.20 cm LA Vol (A2C):   66.0 ml 38.58 ml/m RA Volume:   19.40 ml  11.34 ml/m LA Vol (A4C):   73.3 ml  42.85 ml/m LA Biplane Vol: 75.3 ml 44.02 ml/m  AORTIC VALVE AV Area (Vmax):    2.10 cm AV Area (Vmean):   1.98 cm AV Area (VTI):     1.90 cm AV Vmax:           119.26 cm/s AV Vmean:          81.204 cm/s AV VTI:            0.234 m AV Peak Grad:      5.7 mmHg AV Mean Grad:      2.9 mmHg LVOT Vmax:         79.69 cm/s LVOT Vmean:        51.109 cm/s LVOT VTI:          0.142 m LVOT/AV VTI ratio: 0.61  AORTA Ao Root diam: 2.70 cm MITRAL VALVE MV Area (PHT): 4.12 cm    SHUNTS MV Decel Time: 184 msec    Systemic VTI:  0.14 m MV E velocity: 59.60 cm/s  Systemic Diam: 2.00 cm MV A velocity: 43.30 cm/s MV E/A ratio:  1.38 Dina Rich MD Electronically signed by Dina Rich MD Signature Date/Time: 06/06/2020/4:47:51 PM    Final         No chest pain    Assessment and Plan:   1. SVT up to 155, resolved after adenosine and BB, appears SR though at times difficult to see P waves.  Hx of PAF per old records per Dr. Adele Dan note. Is on eliquis.  And ASA  She was on lopressor 12.5 mg daily.  2. Cardiomyopathy EF 30% in July and now 25-30%. On BB. Now sure if this is new or old - prior to being in this area.  3. Acute systolic HF though CXR not severe, BP improved may benefit from lasix.  She is neg 1350 since admit and wt down 2 Kg. After 1 dose of IV lasix.   4. Troponins  flat at 28, 29 --known CAD though do not know how severe, hx of MI.  But no ischemic work up unless symptomatic  5. Anemia now with hgb 7.2 usually 8.1 to 8.4.  6. Sepsis, due to PNA per IM on ABX 7. CAP, on ABX per IM      For questions or updates, please contact [facility] Please consult [email or web address] for contact info under    Signed, Nada Boozer, NP  06/07/2020 9:16 AM  Attending note  Patient seen and discussed with PA Annie Paras, I agree with her documentation. 74 yo female history of dementia, prior CVA, CAD, PAF, DM2, PAD with nonhealing wounds, colostomy, chronic anemia admitted from SNF with low blood sugars,  sepsis, hypothermia. Being treated for pneumonia by primary team. Cardiology consulted for acute on chronic systolic HF.  Cardiology is consulted for afib and HF.   K 4.4 Cr 1.07 BUN 28 Lactic acid 1.1 WBC 12.6 Hgb 8.5 Plt 302 INR 1.8 TSH 6 BNP 3000 COVID neg hstrop 28-->29 CXR small bialterla effusions, bilateral infiltrate vs atelectasis Echo LVEF 25-30%, global hypokinesis, small to moderate pericardial effusion without tamponade.  Episode of SVT early AM, did not break with adenosine. Suspect potential aflutter. Trying starting low dose beta blocker, if does not tolerate would start oral amiodarone.   Acute on chronic systolic HF, markedly elevated BNP. LVEF 25-30% range, slightly down from prior echo. Soft bp's limit medical therapy and ability to diurese. Certaintly not a candidate for cath or advanced therapies given poor functional status and advanced comorbidities.  Diurese as bp permits, perhaps as sepsis improves bp's may allow for diuresis. Hold diuretic today, reevaluate tomorrow if able to start.    Patient DNR, would consider addressing goals of care in this patient with multiple severe advanced comorbidities. Particularly poor short term prognosis if unable to diurese given her low bp's this admission. Would recommend palliative consultation   Dina Rich MD

## 2020-06-07 NOTE — Progress Notes (Signed)
PROGRESS NOTE  [name] MVH:[id number] DOB: 11/07/1945 DOA: 06/04/2020 PCP: Lindaann Pascal  Brief History   The patient is a 74 yr old woman who carries a past medical history significant for history ofMI, pressure ulcer,dementia,  HLD, CAD, Chronic indwelling catheter, colostomy present, CHF, Afib,andrenal insufficiency. She was sent to the [facility] ED from SNF with complaints of hypoglycemia that did not respond to repeated doses of D 50. When she arrived at the ED she was given more D50 and a meal. Her glucoses seemed to stabilize after that. She is non-verbal except for "yes and no". She wears 2L O2 at baseline.    In the ED the patient was found to be agitated. She had a leukocytosis at 12.6 and a CXR that demonstrated Right greater than left infiltrates. She was hypothermic at 94.8 degrees. She was given Cefepime and Vancomycin initially in the ED. She has now been changed over to doxycycline and rocephin. Lactic acid was 1.4. BNP was 3000. She is severely iron deficient.  She was felt to be septic.   Repeat echocardiogram was obtained on 06/06/2020. It demonstrated a worsening of her EF from 35% to 25-30%. She had arrhythmias last night (atrial flutter) that did not resolve with adenosine. The use of beta blockers is limited due to her low blood pressure.  [facility] were consulted to admit the patient for further evaluation and treatment.   Consultants  . None  Procedures  . None  Antibiotics   Anti-infectives (From admission, onward)   Start     Dose/Rate Route Frequency Ordered Stop   06/05/20 1500  cefTRIAXone (ROCEPHIN) 1 g in sodium chloride 0.9 % 100 mL IVPB        1 g 200 mL/hr over 30 Minutes Intravenous Every 24 hours 06/05/20 0430     06/05/20 1000  doxycycline (VIBRAMYCIN) 100 mg in sodium chloride 0.9 % 250 mL IVPB        100 mg 125 mL/hr over 120 Minutes Intravenous Every 12 hours 06/05/20 0430     06/05/20 0245  ceFEPIme (MAXIPIME) 2 g in sodium  chloride 0.9 % 100 mL IVPB        2 g 200 mL/hr over 30 Minutes Intravenous  Once 06/05/20 0241 06/05/20 0359   06/05/20 0245  vancomycin (VANCOREADY) IVPB 1250 mg/250 mL        1,250 mg 166.7 mL/hr over 90 Minutes Intravenous  Once 06/05/20 0242 06/05/20 0725      Subjective  The patient is lethargic and confused this morning.   Objective   Vitals:  Vitals:   06/06/20 2350 06/07/20 0557  BP: (!) 126/109 (!) 108/49  Pulse: (!) 109 85  Resp: 20 20  Temp:  98.7 F (37.1 C)  SpO2: 90% 100%   Exam:  Constitutional:  The patient is lethargic and confused. Very weak. Appears premorbid.  Respiratory:  . Respirations are  . No wheezes, rales, or rhonchi . No tactile fremitus Cardiovascular:  . Regular rate and rhythm . No murmurs, ectopy, or gallups. . No lateral PMI. No thrills. Abdomen:  . Abdomen is soft, non-tender, non-distended . No hernias, masses, or organomegaly . Normoactive bowel sounds.  Musculoskeletal:  . No cyanosis, clubbing, or edema Skin:  . No rashes, lesions, ulcers . palpation of skin: no induration or nodules Neurologic:  . CN 2-12 intact . Sensation all 4 extremities intact Psychiatric:  . Mental status o Mood, affect appropriate o Orientation to person, place, time  . judgment  and insight appear intact  I have personally reviewed the following:   Today's Data  . Vitals, CMP, CBC, Falate,   Micro Data  . Blood culture x 2 (06/04/2020) No growth  Imaging  . CXR: Bibasilar infiltrates or atelectasis right greater than left . Small bilateral pleural effusions  Cardiology Data  . EKG 9/20/2021Atrial fibrillation with competing juctional pacer. . Echocardiogram (04/16/2020): Near global hypokinesis. Neaar akinesis in inferior and septal walls. EF is 30-35%. Mildly dilated LV. Grade II diastolic dysfunction. Mildly elevated pulmonary artery systolic pressure. Severely dilated left atrial size.   Scheduled Meds: . apixaban  5 mg Oral BID    . Chlorhexidine Gluconate Cloth  6 each Topical Daily  . collagenase   Topical Daily  . famotidine  20 mg Oral Q breakfast  . gabapentin  200 mg Oral TID  . metoprolol tartrate  12.5 mg Oral BID  . QUEtiapine  50 mg Oral QHS   Continuous Infusions: . cefTRIAXone (ROCEPHIN)  IV 1 g (06/06/20 1822)  . 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 CXR = atelectasis vs infiltrate R>L. Cefepime and Vanc started for broad spectrum sepsis coverageAllergic to erythromycin - so no zithromax. Start doxycycline and rocephin. Blood cultures and sputum culture have had no growth. Monitor.   CAP: Continue Rocephin and doxycycline. Covid negative.  CHF: Severe hypokinesis on echo from 04/06/2020. Will repeat study and recheck CXR and BNP. BNP on admission was 3003.0.  Question if hypotension today is due to sepsis vs CHF. She cannot tolerate diuresis given hypotension. Monitor volume status. EF by Echo performed on 06/06/2020 is worse than when echo was performed in July.  Unable to diurese due to low blood pressures. Cardiology consulted. They feel that she is appropriate for palliative care due to very poor prognosis. I have consulted palliative care and discussed the patient's status with her daughter [name].  Atrial flutter: Overnight. No response to adenosine. Cardiology consulted. Amiodarone to be considered as patient's blood pressure will not tolerate beta blockers or calcium channel blockers.  Hypoglycemia: One episode of glucose of 54 on the night of 06/04/2020. The patient's glucoses have been normal on no insulin. Continue to monitor.   Chronic anemia with severe iron deficiency anemia. Hemoglobin is stable. No signs or symptoms of active bleeding.  Continue to monitor.  I have seen and examined this patient myself. I have spent 52 minutes in her evaluation and care. More than 50% of this was spent in coordination of care with cardiology and palliative care as well as counseling with the patient's daughter, [name].  DVT Prophylaxis-  Eliquis - SCDs  Family Communication: I have discussed the patient in detail with her daughter [name]. Code Status:  DNR Admission status: Inpatient  Status is: Inpatient  Remains inpatient appropriate because:Hemodynamically unstable and IV treatments appropriate due to intensity of illness or inability to take PO   Dispo: The patient is from: SNF              Anticipated d/c is to: tbd. Awaiting palliative care and family's decision regarding comfort care.              Anticipated d/c date is: 3 days  Patient currently is not medically stable to d/c.   Kyliyah Stirn, DO [facility] Direct contact: see [email or web address]  7PM-7AM contact night coverage as above 06/07/2020, 1:07 PM  LOS: 1 day

## 2020-06-07 NOTE — Progress Notes (Signed)
At 0630 this nurse noted on monitor that hr was in 160's. Patient groaning and wiggling in bed. Patient appeared gray in color. Notified primary nurse. EKG obtained which confirmed SVT. Dr. Norina Buzzard who came up to see patient. Patient placed on crash cart monitor and order give for 6 mg of adenosine per order and protocol which decreased hr to 80's.Patient heart rate went back to 150's after initial dose. Repeat verbal order after two minute for and addition dose of 6 mg of adenosine with same effect. After two minutes verbal order received for 12 mg of adenosine which was given under rapid bolus as per protocol. HR was still running in 120's to 140's after third dose. Order given for 5 mg metoprolol. Which was effective Patient heart rate currently in 80's to 90's at present time. No order to transfer patient at time. Continue to monitor.

## 2020-06-07 NOTE — Consult Note (Signed)
Events of last night and this morning noted. Cardiology note reviewed. Patient just returned from ultrasound. I did look at her left fourth toe which revealed dry gangrene with some cyanosis along the sole of the foot beneath the left metatarsal head. Multiple toes with thin skin in the face of contractures of the lower extremities. Will follow up on Doppler studies. No need for surgical intervention at this point. Would continue wound care as per the wound care nurse. Patient's overall prognosis is poor and doubtful that any of these wounds will heal. Please call me if I can be of further assistance.

## 2020-06-07 NOTE — Progress Notes (Signed)
Contacted by palliative care, family has elected for comfort care. Cardiology will sign off inpatient care   Dina Rich MD

## 2020-06-08 DIAGNOSIS — K117 Disturbances of salivary secretion: Secondary | ICD-10-CM

## 2020-06-08 DIAGNOSIS — D649 Anemia, unspecified: Secondary | ICD-10-CM

## 2020-06-08 MED ORDER — SCOPOLAMINE 1 MG/3DAYS TD PT72: 1.0000 | MEDICATED_PATCH | TRANSDERMAL | Status: DC

## 2020-06-08 NOTE — TOC Progression Note (Signed)
Transition of Care [facility]) - Progression Note    Patient Details  Name: [name] MRN: [id number] Date of Birth: 06/13/1946  Transition of Care [facility]) CM/SW Contact  Elliot Gault, LCSW Phone Number: 06/08/2020, 11:58 AM  Clinical Narrative:     TOC following. Per Palliative APNP, family requesting referral to [facility] at [facility]. Referral made to [facility] at hospice. Awaiting return call regarding determination.   Expected Discharge Plan: Skilled Nursing Facility Barriers to Discharge: Continued Medical Work up  Expected Discharge Plan and Services Expected Discharge Plan: Skilled Nursing Facility In-house Referral: Clinical Social Work     Living arrangements for the past 2 months: Skilled Nursing Facility                                       Social Determinants of Health (SDOH) Interventions    Readmission Risk Interventions Readmission Risk Prevention Plan 06/05/2020  Transportation Screening Complete  HRI or Home Care Consult Complete  Social Work Consult for Recovery Care Planning/Counseling Complete  Palliative Care Screening Not Applicable  Medication Review [employer]) Complete

## 2020-06-08 NOTE — Care Management Important Message (Signed)
Important Message  Patient Details  Name: [name] MRN: [id number] Date of Birth: 1946-08-01   Medicare Important Message Given:  Yes     Corey Harold 06/08/2020, 12:54 PM

## 2020-06-08 NOTE — Progress Notes (Signed)
Pt resting with eyes open. Nad. No obvious ss of pain noted. Nad.

## 2020-06-08 NOTE — Progress Notes (Signed)
Daily Progress Note   Patient Name: [name]       Date: 06/08/2020 DOB: [birth year]  Age: 74 y.o. MRN#: [id number] Attending Physician: Fran Lowes, DO Primary Care Physician: Lindaann Pascal Admit Date: 06/04/2020  Reason for Consultation/Follow-up: Establishing goals of care and Terminal Care  Subjective: Patient lethargic but appears comfortable without distress. Audible secretions. Will order scopolamine patch.   GOC: F/u with daughter, [name]. Provided update on plan of care and comfort measures. She again confirms decision to keep her mother comfortable. Son plans to visit this afternoon. Discussed hospice facility transfer when bed available. Daughter agreeable with hospice facility transfer. PMT contact information given and encouraged her to call with questions or concerns. Unfortunately [name] has covid and in quarantine, therefore will not be visiting her mother.   Length of Stay: 3  Current Medications: Scheduled Meds:  . collagenase   Topical Daily  . QUEtiapine  50 mg Oral QHS  . scopolamine  1 patch Transdermal Q72H    Continuous Infusions:   PRN Meds: acetaminophen **OR** acetaminophen, bisacodyl, diphenhydrAMINE, glycopyrrolate, LORazepam, morphine injection, morphine CONCENTRATE, oxyCODONE, polyethylene glycol  Physical Exam Vitals and nursing note reviewed.  Constitutional:      Appearance: She is ill-appearing.  Pulmonary:     Effort: No tachypnea, accessory muscle usage or respiratory distress.     Breath sounds: Rhonchi present.     Comments: Audible secretions Skin:    General: Skin is warm.     Comments: B/l foot wounds. Dressings C/D/I  Neurological:     Mental Status: She is lethargic.            Vital Signs: BP (!) 128/56 (BP Location: Left  Arm)   Pulse (!) 106   Temp 98.2 F (36.8 C)   Resp 18   Ht 5\' 2"  (1.575 m)   Wt 66.5 kg   SpO2 100%   BMI 26.81 kg/m  SpO2: SpO2: 100 % O2 Device: O2 Device: Nasal Cannula O2 Flow Rate: O2 Flow Rate (L/min): 3 L/min  Intake/output summary:   Intake/Output Summary (Last 24 hours) at 06/08/2020 0928 Last data filed at 06/07/2020 1700 Gross per 24 hour  Intake 0 ml  Output --  Net 0 ml   LBM: Last BM Date: 06/08/20 Baseline Weight: Weight: 69.9 kg Most recent weight: Weight: 66.5  kg       Palliative Assessment/Data: PPS 20%    Flowsheet Rows     Most Recent Value  Intake Tab  Referral Department Hospitalist  Unit at Time of Referral Med/Surg Unit  Palliative Care Primary Diagnosis Other (Comment)  Palliative Care Type New Palliative care  Reason for referral Clarify Goals of Care  Date first seen by Palliative Care 06/07/20  Clinical Assessment  Palliative Performance Scale Score 20%  Psychosocial & Spiritual Assessment  Palliative Care Outcomes  Patient/Family meeting held? Yes  Who was at the meeting? daughter [name])  Palliative Care Outcomes Improved pain interventions, Improved non-pain symptom therapy, Clarified goals of care, Counseled regarding hospice, Provided end of life care assistance, Provided psychosocial or spiritual support, Changed to focus on comfort, Transitioned to hospice, ACP counseling assistance      Patient Active Problem List   Diagnosis Date Noted  . Cellulitis of toe of left foot   . Hypoglycemia   . Pneumonia due to infectious organism   . Congestive heart failure (HCC)   . Palliative care by specialist   . Goals of care, counseling/discussion   . Terminal care   . Sepsis (HCC) 06/05/2020  . Cerebral thrombosis with cerebral infarction 03/29/2020  . Pressure injury of skin 03/26/2020  . 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 PAD (peripheral artery disease) (HCC) 03/25/2020  . Peripheral  arterial disease (HCC) 03/25/2020    Palliative Care Assessment & Plan   Patient Profile: 74 y.o. female  with past medical history of dementia, MI, CAD, CHF, afib, bilateral non-healing foot wounds, PAD, chronic indwelling catheter, HLD, renal insufficiency admitted on 06/04/2020 with hypoglycemia from SNF. Hospital admission for sepsis secondary to pneumonia/CAP, CHF with EF 25-30%, atrial flutter, hypoglycemia, chronic anemia, chronic bilateral foot wounds. Diuresis has been challenging with soft blood pressures. She has been seen by vascular surgery outpatient and managed for foot wounds with concern for eventually requiring bilateral amputations versus palliative approach to care. Cardiology following. Patient is not a candidate for cath or advanced therapies due to poor functional status and multiple co-morbidities. Cardiology recommending palliative medicine consultation for goals of care.  Assessment: Sepsis Pneumonia CHF with EF 25-30% Hypotension Atrial flutter Chronic anemia with severe iron deficiency anemia Dementia Hypoglycemia  Recommendations/Plan:  Comfort measures only after discussions with daughter, Hilda Lias. Family consensus for comfort. They do not wish to see her suffer any longer.  Continue symptom management medications on MAR.  Comfort feeds per patient/family request. Aspiration precautions.   Unrestricted visitor access.  Wound care as needed.  TOC team referral for residential hospice placement when bed available.   Goals of Care and Additional Recommendations:  Limitations on Scope of Treatment: Full Comfort Care  Code Status: DNR   Code Status Orders  (From admission, onward)         Start     Ordered   06/05/20 0424  Do not attempt resuscitation (DNR)  Continuous       Question Answer Comment  In the event of cardiac or respiratory ARREST Do not call a "code blue"   In the event of cardiac or respiratory ARREST Do not perform Intubation,  CPR, defibrillation or ACLS   In the event of cardiac or respiratory ARREST Use medication by any route, position, wound care, and other measures to relive pain and suffering. May use oxygen, suction and manual treatment of airway obstruction as needed for comfort.      06/05/20 0424  Code Status History    Date Active Date Inactive Code Status Order ID Comments User Context   06/04/2020 2246 06/05/2020 0424 DNR [id number]  Vanetta Mulders, MD ED   03/25/2020 0935 04/01/2020 2357 Full Code [id number]  Chuck Hint, MD Inpatient   Advance Care Planning Activity    Advance Directive Documentation     Most Recent Value  Type of Advance Directive Out of facility DNR (pink MOST or yellow form)  Pre-existing out of facility DNR order (yellow form or pink MOST form) --  "MOST" Form in Place? --       Prognosis:   Poor prognosis: weeks-days  Discharge Planning:  Hospice facility  Care plan was discussed with daughter [name]), updated Dr. Gerri Lins  Thank you for allowing the Palliative Medicine Team to assist in the care of this patient.   Total Time 20 Prolonged Time Billed  no      Greater than 50%  of this time was spent counseling and coordinating care related to the above assessment and plan.  Vennie Homans, DNP, FNP-C Palliative Medicine Team  Phone: [phone] Fax: [phone]  Please contact Palliative Medicine Team phone at [phone] for questions and concerns.

## 2020-06-08 NOTE — Progress Notes (Addendum)
PROGRESS NOTE  Ingris Pasquarella XYV:[id number] DOB: 29-Jan-1946 DOA: 06/04/2020 PCP: Lindaann Pascal  Brief History   The patient is a 74 yr old woman who carries a past medical history significant for history ofMI, pressure ulcer,dementia,  HLD, CAD, Chronic indwelling catheter, colostomy present, CHF, Afib,andrenal insufficiency. She was sent to the [facility] ED from SNF with complaints of hypoglycemia that did not respond to repeated doses of D 50. When she arrived at the ED she was given more D50 and a meal. Her glucoses seemed to stabilize after that. She is non-verbal except for "yes and no". She wears 2L O2 at baseline.    In the ED the patient was found to be agitated. She had a leukocytosis at 12.6 and a CXR that demonstrated Right greater than left infiltrates. She was hypothermic at 94.8 degrees. She was given Cefepime and Vancomycin initially in the ED. She has now been changed over to doxycycline and rocephin. Lactic acid was 1.4. BNP was 3000. She is severely iron deficient.  She was felt to be septic.   [facility] were consulted to admit the patient for further evaluation and treatment.   Repeat echocardiogram was obtained on 06/06/2020. It demonstrated a worsening of her EF from 35% to 25-30%. She had arrhythmias last night (atrial flutter) that did not resolve with adenosine. The use of beta blockers is limited due to her low blood pressure. Cardiology was consulted. They agreed with palliative care consult. Both palliative care and I discussed the patient with her family. The family opted for comfort care. Today they have agreed to seek residential hospice placement.  Consultants  . Cardiology  Procedures  . None  Antibiotics   Anti-infectives (From admission, onward)   Start     Dose/Rate Route Frequency Ordered Stop   06/05/20 1500  cefTRIAXone (ROCEPHIN) 1 g in sodium chloride 0.9 % 100 mL IVPB  Status:  Discontinued        1 g 200 mL/hr over 30 Minutes  Intravenous Every 24 hours 06/05/20 0430 06/07/20 1336   06/05/20 1000  doxycycline (VIBRAMYCIN) 100 mg in sodium chloride 0.9 % 250 mL IVPB  Status:  Discontinued        100 mg 125 mL/hr over 120 Minutes Intravenous Every 12 hours 06/05/20 0430 06/07/20 1336   06/05/20 0245  ceFEPIme (MAXIPIME) 2 g in sodium chloride 0.9 % 100 mL IVPB        2 g 200 mL/hr over 30 Minutes Intravenous  Once 06/05/20 0241 06/05/20 0359   06/05/20 0245  vancomycin (VANCOREADY) IVPB 1250 mg/250 mL        1,250 mg 166.7 mL/hr over 90 Minutes Intravenous  Once 06/05/20 0242 06/05/20 0725      Subjective  The patient is more alert this morning and conversant.  Objective   Vitals:  Vitals:   06/07/20 1420 06/08/20 0513  BP: 118/66 (!) 128/56  Pulse: 93 (!) 106  Resp: 20 18  Temp: 97.6 F (36.4 C) 98.2 F (36.8 C)  SpO2: 100% 100%   Exam:  Constitutional:  The patient is more alert and awake than yesterday. She is conversant, but mostly automatic responses. Respiratory:  . Respirations are  . No wheezes or rales . Positive for rhonchi . No tactile fremitus Cardiovascular:  . Regular rate and rhythm . No murmurs, ectopy, or gallups. . No lateral PMI. No thrills. Abdomen:  . Abdomen is soft, non-tender, non-distended . No hernias, masses, or organomegaly . Normoactive bowel sounds.  Musculoskeletal:  . No cyanosis, clubbing, or edema Skin:  . No rashes, lesions, ulcers . palpation of skin: no induration or nodules Neurologic:  Unable to evaluate as the patient is unable to cooperate with exam. Psychiatric:  . Unable to evaluate as the patient is unable to cooperate with exam.  I have personally reviewed the following:   Today's Data  . Dispensing optician  . Blood culture x 2 (06/04/2020) No growth  Imaging  . CXR: Bibasilar infiltrates or atelectasis right greater than left . Small bilateral pleural effusions  Cardiology Data  . EKG 9/20/2021Atrial fibrillation with competing  juctional pacer. . Echocardiogram (04/16/2020): Near global hypokinesis. Neaar akinesis in inferior and septal walls. EF is 30-35%. Mildly dilated LV. Grade II diastolic dysfunction. Mildly elevated pulmonary artery systolic pressure. Severely dilated left atrial size. . Echocardiogram 06/05/2020:  worsening of her EF from 35% to 25-30%.  Scheduled Meds: . collagenase   Topical Daily  . QUEtiapine  50 mg Oral QHS  . 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 CXR = atelectasis vs infiltrate R>L. Cefepime and Vanc started for broad spectrum sepsis coverageAllergic to erythromycin - so no zithromax. Start doxycycline and rocephin. Blood cultures and sputum culture have had no growth. Monitor. Question sepsis vs CHF exacerbation with hypoglycemia as a cause of hypothermia. Pt is now on comfort care. No further fevers.   CAP: Continue Rocephin and doxycycline. Covid negative. Pt is now on comfort care.  CHF: Severe hypokinesis on echo from 04/06/2020. Will repeat study and recheck CXR and BNP. BNP on admission was 3003.0.  Question if hypotension today is due to sepsis vs CHF. She cannot tolerate diuresis given hypotension. Monitor volume status. EF by Echo performed on 06/06/2020 is worse than when echo was performed in July.  Unable to diurese due to low blood pressures. Cardiology consulted. They feel that she is appropriate for palliative care due to very poor prognosis. I have consulted palliative care and  discussed the patient's status with her daughter [name]. Pt is now on comfort care.  Atrial flutter: Overnight. No response to adenosine. Cardiology consulted. Amiodarone to be considered as patient's blood pressure will not tolerate beta blockers or calcium channel blockers. Pt is now on comfort care.  Hypoglycemia: One episode of glucose of 54 on the night of 06/04/2020. The patient's glucoses have been normal on no insulin. Continue to monitor. This is likely the cause of the patient's hypothermia upon presentation. Pt is now on comfort care.  Chronic anemia with severe iron deficiency anemia. Hemoglobin is stable. No signs or symptoms of active bleeding. Continue to monitor. Pt is now on comfort care.  I have seen and examined this patient myself. I have spent 52 minutes in her evaluation and care. More than 50% of this was spent in coordination of care with cardiology and palliative care as well as counseling with the patient's daughter, .  DVT Prophylaxis-  Eliquis - SCDs  Family Communication: I have discussed the patient in detail with her daughter Olegario Messier. Code Status:  DNR Admission status: Inpatient  Status is: Inpatient  Remains inpatient appropriate because:Hemodynamically unstable and IV treatments appropriate due to intensity of illness or inability to take PO   Dispo: The patient is from: SNF              Anticipated d/c is [id number]..              Anticipated d/c date is: 2 days              Patient currently is not medically stable to d/c. Due to no safe discharge.  Arlene Brickel, DO [facility] Direct contact: see [email or web address]  7PM-7AM contact night coverage as above 06/08/2020, 12:12 PM  LOS: 1 day   ADDENDUM: I have discussed the patient with Dr. Doyne Keel with residential hospice. He asks that the hospitalist caring for the patient tomorrow gives him a call about him the patient in the morning. The number is [phone].

## 2020-06-08 NOTE — Progress Notes (Signed)
Spoke with daughter around 0900. Updated her. She had multiple questions so I advised her I would send Dr [name] a message to call her. Sent Dr [name] secure chat with daughter name and number. Advised daughter to call me back by 5 if she hasnt heard from dr. Huel Cote agreed. Pt resting. nad

## 2020-06-08 NOTE — Progress Notes (Signed)
Pt resting. Mittens in placed. Bandages noted to both feet. 02 3L [place] intact. PICC to right arm intact and saline locked.  Lung sounds congested with expiration. Pt unable to understand to try and cough. Foley intact .

## 2020-06-09 LAB — CULTURE, BLOOD (ROUTINE X 2)
Culture: NO GROWTH
Special Requests: ADEQUATE

## 2020-06-09 LAB — RESPIRATORY PANEL BY RT PCR (FLU A&B, COVID)
Influenza A by PCR: NEGATIVE
Influenza B by PCR: NEGATIVE
SARS Coronavirus 2 by RT PCR: NEGATIVE

## 2020-06-09 MED ORDER — MORPHINE SULFATE (CONCENTRATE) 10 MG/0.5ML PO SOLN: 10.0000 mg | ORAL | Status: DC | PRN

## 2020-06-09 MED ORDER — MORPHINE SULFATE (CONCENTRATE) 10 MG/0.5ML PO SOLN
5.0000 mg | Freq: Four times a day (QID) | ORAL | Status: DC
  Administered 2020-06-09: 5 mg via SUBLINGUAL
  Filled 2020-06-09: qty 0.5

## 2020-06-09 NOTE — Progress Notes (Signed)
Daily Progress Note   Patient Name: [name]       Date: 06/09/2020 DOB: 11/29/1945  Age: 74 y.o. MRN#: [id number] Attending Physician: Burnadette Pop, MD Primary Care Physician: Lindaann Pascal Admit Date: 06/04/2020  Reason for Consultation/Follow-up: Establishing goals of care and Terminal Care  Subjective: Patient appears comfortable and resting in the bed. No family at bedside. Per NT at bedside, patient intermittently grimacing this morning.   GOC: Received call from daughter, [name]. Provided update on plan of care including transfer to hospice facility today for ongoing comfort measures. [name] also shares that when her brother visited yesterday, her mother was uncomfortable and grimacing. [name] is ok with scheduling Roxanol for non-verbal signs of pain. Explained that she has scopolamine patch on too. Answered questions. Emotional support provided.   Length of Stay: 4  Current Medications: Scheduled Meds:  . collagenase   Topical Daily  . morphine CONCENTRATE  5 mg Sublingual Q6H  . QUEtiapine  50 mg Oral QHS  . scopolamine  1 patch Transdermal Q72H    Continuous Infusions:   PRN Meds: acetaminophen **OR** acetaminophen, bisacodyl, diphenhydrAMINE, glycopyrrolate, LORazepam, morphine injection, morphine CONCENTRATE, oxyCODONE, polyethylene glycol  Physical Exam Vitals and nursing note reviewed.  Constitutional:      Appearance: She is ill-appearing.  Pulmonary:     Effort: No tachypnea, accessory muscle usage or respiratory distress.     Breath sounds: Rhonchi present.     Comments: Audible secretions Skin:    General: Skin is warm.     Comments: B/l foot wounds. Dressings C/D/I  Neurological:     Mental Status: She is lethargic.            Vital Signs: BP  120/78 (BP Location: Left Arm)   Pulse (!) 107   Temp 98.4 F (36.9 C)   Resp 18   Ht 5\' 2"  (1.575 m)   Wt 66.5 kg   SpO2 100%   BMI 26.81 kg/m  SpO2: SpO2: 100 % O2 Device: O2 Device: Nasal Cannula O2 Flow Rate: O2 Flow Rate (L/min): 4 L/min  Intake/output summary:   Intake/Output Summary (Last 24 hours) at 06/09/2020 1219 Last data filed at 06/09/2020 0900 Gross per 24 hour  Intake 0 ml  Output 550 ml  Net -550 ml   LBM: Last BM Date: 06/09/20 Baseline  Weight: Weight: 69.9 kg Most recent weight: Weight: 66.5 kg       Palliative Assessment/Data: PPS 10%    Flowsheet Rows     Most Recent Value  Intake Tab  Referral Department Hospitalist  Unit at Time of Referral Med/Surg Unit  Palliative Care Primary Diagnosis Other (Comment)  Palliative Care Type New Palliative care  Reason for referral Clarify Goals of Care  Date first seen by Palliative Care 06/07/20  Clinical Assessment  Palliative Performance Scale Score 20%  Psychosocial & Spiritual Assessment  Palliative Care Outcomes  Patient/Family meeting held? Yes  Who was at the meeting? daughter [name])  Palliative Care Outcomes Improved pain interventions, Improved non-pain symptom therapy, Clarified goals of care, Counseled regarding hospice, Provided end of life care assistance, Provided psychosocial or spiritual support, Changed to focus on comfort, Transitioned to hospice, ACP counseling assistance      Patient Active Problem List   Diagnosis Date Noted  . Anemia   . Increased oropharyngeal secretions   . Cellulitis of toe of left foot   . Hypoglycemia   . Pneumonia due to infectious organism   . Congestive heart failure (HCC)   . Palliative care by specialist   . Goals of care, counseling/discussion   . Terminal care   . Sepsis (HCC) 06/05/2020  . Cerebral thrombosis with cerebral infarction 03/29/2020  . Pressure injury of skin 03/26/2020  . 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 PAD (peripheral artery disease) (HCC) 03/25/2020  . Peripheral arterial disease (HCC) 03/25/2020    Palliative Care Assessment & Plan   Patient Profile: 74 y.o. female  with past medical history of dementia, MI, CAD, CHF, afib, bilateral non-healing foot wounds, PAD, chronic indwelling catheter, HLD, renal insufficiency admitted on 06/04/2020 with hypoglycemia from SNF. Hospital admission for sepsis secondary to pneumonia/CAP, CHF with EF 25-30%, atrial flutter, hypoglycemia, chronic anemia, chronic bilateral foot wounds. Diuresis has been challenging with soft blood pressures. She has been seen by vascular surgery outpatient and managed for foot wounds with concern for eventually requiring bilateral amputations versus palliative approach to care. Cardiology following. Patient is not a candidate for cath or advanced therapies due to poor functional status and multiple co-morbidities. Cardiology recommending palliative medicine consultation for goals of care.  Assessment: Sepsis Pneumonia CHF with EF 25-30% Hypotension Atrial flutter Chronic anemia with severe iron deficiency anemia Dementia Hypoglycemia  Recommendations/Plan:  Comfort measures only after discussions with daughter, Hilda Lias. Family consensus for comfort. They do not wish to see her suffer any longer.  Continue symptom management medications on MAR. Scheduled low dose Roxanol.  Comfort feeds per patient/family request. Aspiration precautions.   Unrestricted visitor access.  Wound care as needed.  TOC team referral for residential hospice placement when bed available. Likely transfer to [facility] today.  Goals of Care and Additional Recommendations:  Limitations on Scope of Treatment: Full Comfort Care  Code Status: DNR   Code Status Orders  (From admission, onward)         Start     Ordered   06/05/20 0424  Do not attempt resuscitation (DNR)  Continuous       Question Answer Comment    In the event of cardiac or respiratory ARREST Do not call a "code blue"   In the event of cardiac or respiratory ARREST Do not perform Intubation, CPR, defibrillation or ACLS   In the event of cardiac or respiratory ARREST Use medication by any route, position, wound care,  and other measures to relive pain and suffering. May use oxygen, suction and manual treatment of airway obstruction as needed for comfort.      06/05/20 0424        Code Status History    Date Active Date Inactive Code Status Order ID Comments User Context   06/04/2020 2246 06/05/2020 0424 DNR [id number]  Vanetta Mulders, MD ED   03/25/2020 0935 04/01/2020 2357 Full Code [id number]  Chuck Hint, MD Inpatient   Advance Care Planning Activity    Advance Directive Documentation     Most Recent Value  Type of Advance Directive Out of facility DNR (pink MOST or yellow form)  Pre-existing out of facility DNR order (yellow form or pink MOST form) --  "MOST" Form in Place? --       Prognosis:   Poor prognosis: weeks-days  Discharge Planning:  Hospice facility  Care plan was discussed with daughter [name]), Dr. Renford Dills  Thank you for allowing the Palliative Medicine Team to assist in the care of this patient.   Total Time 15 Prolonged Time Billed  no       Greater than 50% of this time was spent counseling and coordinating care related to the above assessment and plan.   Vennie Homans, DNP, FNP-C Palliative Medicine Team  Phone: [phone] Fax: [phone]  Please contact Palliative Medicine Team phone at [phone] for questions and concerns.

## 2020-06-09 NOTE — TOC Transition Note (Signed)
Transition of Care [facility]) - CM/SW Discharge Note   Patient Details  Name: [name] MRN: [id number] Date of Birth: 07-Nov-1945  [facility] of [facility] CM/SW Contact:  Elliot Gault, LCSW Phone Number: 06/09/2020, 11:56 AM   Clinical Narrative:     Pt accepted at residential hospice facility for today. Pt will need negative covid test result before she can transfer. Called EMS and was told that they are backed up and cannot give an ETA. Updated pt's daughter by phone. RN and MD aware.  No other TOC needs for dc.   Final next level of care: Hospice Medical Facility Barriers to Discharge: Barriers Resolved   Patient Goals and CMS Choice Patient states their goals for this hospitalization and ongoing recovery are:: return to SNF      Discharge Placement                       Discharge Plan and Services In-house Referral: Clinical Social Work                                   Social Determinants of Health (SDOH) Interventions     Readmission Risk Interventions Readmission Risk Prevention Plan 06/05/2020  Transportation Screening Complete  HRI or Home Care Consult Complete  Social Work Consult for Recovery Care Planning/Counseling Complete  Palliative Care Screening Not Applicable  Medication Review Oceanographer) Complete

## 2020-06-09 NOTE — Discharge Summary (Signed)
Physician Discharge Summary  [name] MBW:[id number] DOB: Dec 02, 1945 DOA: 06/04/2020  PCP: [name]  Admit date: 06/04/2020 Discharge date: 06/09/2020  Admitted From: Home Disposition: Residential Hospice  Discharge Condition:Stable CODE STATUS: DNR Diet recommendation:  Regular  Brief/Interim Summary:  The patient is a 74 yr old woman who carries a past medical history significant forhistory ofMI, pressure ulcer,dementia,HLD, CAD, Chronic indwelling catheter, colostomy present, CHF, Afib,andrenal insufficiency. She was sent to the [facility] ED from SNF with complaints of hypoglycemia that did not respond to repeated doses of D 50. When she arrived at the ED she was given more D50 and a meal.She wears 2L O2 at baseline.  In the ED the patient was found to be agitated. She had a leukocytosis at 12.6 and a CXR that demonstrated Right greater than left infiltrates. She was hypothermic at 94.8 degrees. She was given Cefepime and Vancomycin initially in the ED. She has now been changed over to doxycycline and rocephin. Lactic acid was 1.4. BNP was 3000. She is severely iron deficient. She was felt to be septic.  [facility] were consulted to admit the patient for further evaluation and treatment.  Repeat echocardiogram was obtained on 06/06/2020. It demonstrated a worsening of her EF from 35% to 25-30%.  Cardiology was consulted.  After extensive discussion with the family, her care was transitioned to comfort care.  Our plan is to discharge her to residential hospice today.  Following problems were addressed during her hospitalization:  Sepsis 2/2 pneumonia: WBC 12.6, hypothermia 94.8. CXR = atelectasis vs infiltrate R>L. Cefepime and Vanc started for broad spectrum sepsis coverageAllergic to erythromycin - so no zithromax. Started on doxycycline and rocephin. Blood cultures and sputum culture have had no growth. Monitor. Question sepsis vs CHF exacerbation with  hypoglycemia as a cause of hypothermia. Pt is now on comfort care. .   CAP:Was on Rocephin and doxycycline. Covid negative. Pt is now on comfort care.  CHF: Severe hypokinesis on echo from 04/06/2020. Will repeat study and recheck CXR and BNP. BNP on admission was 3003.0.  Question if hypotension today is due to sepsis vs CHF. She cannot tolerate diuresis given hypotension.  EF by Echo performed on 06/06/2020 is worse than when echo was performed in July.  Unable to diurese due to low blood pressures. Cardiology consulted. They feel that she is appropriate for palliative care due to very poor prognosis.We consulted palliative care and discussed the patient's status with her daughter [name]. Pt is now on comfort care.  Atrial flutter: . No response to adenosine. Cardiology consulted. Amiodarone to be considered as patient's blood pressure will not tolerate beta blockers or calcium channel blockers. Pt is now on comfort care.  Hypoglycemia: One episode of glucose of 54 on the night of 06/04/2020. The patient's glucoses have been normal on no insulin. Continue to monitor. This is likely the cause of the patient's hypothermia upon presentation. Pt is now on comfort care.  Chronic anemia with severe iron deficiency anemia. Hemoglobin is stable. No signs or symptoms of active bleeding. Continue to monitor. Pt is now on comfort care.   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)   PAD (peripheral artery disease) (HCC)   Pressure injury of skin   Sepsis (HCC)   Cellulitis of toe of left foot   Hypoglycemia   Pneumonia due to infectious organism   Congestive heart failure [facility])   Palliative care by specialist   Goals of  care, counseling/discussion   Terminal care   Anemia   Increased oropharyngeal secretions    Discharge Instructions  Discharge Instructions    No wound care   Complete by: As directed      Allergies as of 06/09/2020       Reactions   Codeine    Erythromycin    Sulfa Antibiotics       Medication List    STOP taking these medications   acetaminophen 500 MG tablet Commonly known as: TYLENOL   aspirin EC 81 MG tablet   Eliquis 5 MG Tabs tablet Generic drug: apixaban   famotidine 20 MG tablet Commonly known as: PEPCID   feeding supplement (PRO-STAT SUGAR FREE 64) Liqd   furosemide 20 MG tablet Commonly known as: LASIX   gabapentin 100 MG capsule Commonly known as: NEURONTIN   heparin flush 10 UNIT/ML Soln injection   HumaLOG KwikPen 100 UNIT/ML KwikPen Generic drug: insulin lispro   HYDROcodone-acetaminophen 5-325 MG tablet Commonly known as: NORCO/VICODIN   Levemir 100 UNIT/ML injection Generic drug: insulin detemir   LORazepam 0.5 MG tablet Commonly known as: ATIVAN   metoprolol tartrate 25 MG tablet Commonly known as: LOPRESSOR   multivitamin capsule   polyethylene glycol 17 g packet Commonly known as: MIRALAX / GLYCOLAX   QUEtiapine 50 MG tablet Commonly known as: SEROQUEL   sertraline 25 MG tablet Commonly known as: ZOLOFT   tamsulosin 0.4 MG Caps capsule Commonly known as: FLOMAX   vancomycin  IVPB   Vitamin C 500 MG Chew   Vitamin D 50 MCG (2000 UT) tablet   zinc sulfate 220 (50 Zn) MG capsule       Allergies  Allergen Reactions  . Codeine   . Erythromycin   . Sulfa Antibiotics     Consultations:  Palliative care,cardiology   Procedures/Studies: DG Chest 2 View  Result Date: 06/04/2020 CLINICAL DATA:  Hypoglycemia. EXAM: CHEST - 2 VIEW COMPARISON:  March 16, 2020 FINDINGS: A right-sided PICC line is noted with its distal tip seen at the junction of the superior vena cava and right atrium. Mild, diffuse chronic appearing increased interstitial lung markings are seen. Marked severity areas of atelectasis and/or infiltrate are seen within the bilateral lung bases, right greater than left. Small bilateral pleural effusions are noted. No  pneumothorax is seen. The cardiac silhouette is markedly enlarged and unchanged in size. Degenerative changes seen throughout the thoracic spine. A chronic deformity is seen involving the surgical neck of the proximal right humerus. IMPRESSION: 1. Chronic appearing increased interstitial lung markings with marked severity bibasilar atelectasis and/or infiltrate, right greater than left. 2. Small bilateral pleural effusions. 3. Stable cardiomegaly. Electronically Signed   By: Aram Candela M.D.   On: 06/04/2020 23:11   US ARTERIAL ABI (SCREENING LOWER EXTREMITY)  Result Date: 06/07/2020 CLINICAL DATA:  74 year old female with a history vascular disease EXAM: NONINVASIVE PHYSIOLOGIC VASCULAR STUDY OF BILATERAL LOWER EXTREMITIES TECHNIQUE: Evaluation of both lower extremities was performed at rest, including calculation of ankle-brachial indices, multiple segmental pressure evaluation, segmental Doppler and segmental pulse volume recording. COMPARISON:  None. FINDINGS: Right ABI:  0.31 Left ABI:  Non calculable Right Lower Extremity: Segmental Doppler at the right ankle demonstrates monophasic posterior tibial artery. Absent dorsalis pedis. Left Lower Extremity: Segmental Doppler at the left ankle demonstrates absent waveforms. IMPRESSION: Right: Resting ABI in the severe range arterial occlusive disease. Segmental Doppler at the right ankle demonstrates more proximal occlusive disease. Left: Non calculable ABI, with absent arterial waveforms. Signed,  Yvone Neu. Reyne Dumas, RPVI [facility] Electronically Signed   By: Gilmer Mor D.O.   On: 06/07/2020 12:44   US ARTERIAL LOWER EXTREMITY DUPLEX LEFT (NON-ABI)  Result Date: 06/07/2020 CLINICAL DATA:  74 year old female with a history vascular disease EXAM: NONINVASIVE PHYSIOLOGIC VASCULAR STUDY OF BILATERAL LOWER EXTREMITIES TECHNIQUE: Evaluation of both lower extremities was performed at rest,  including calculation of ankle-brachial indices, multiple segmental pressure evaluation, segmental Doppler and segmental pulse volume recording. COMPARISON:  None. FINDINGS: Right ABI:  0.31 Left ABI:  Non calculable Right Lower Extremity: Segmental Doppler at the right ankle demonstrates monophasic posterior tibial artery. Absent dorsalis pedis. Left Lower Extremity: Segmental Doppler at the left ankle demonstrates absent waveforms. IMPRESSION: Right: Resting ABI in the severe range arterial occlusive disease. Segmental Doppler at the right ankle demonstrates more proximal occlusive disease. Left: Non calculable ABI, with absent arterial waveforms. Signed, Yvone Neu. Reyne Dumas, RPVI [facility] Electronically Signed   By: Gilmer Mor D.O.   On: 06/07/2020 12:44   ECHOCARDIOGRAM COMPLETE  Result Date: 06/06/2020    ECHOCARDIOGRAM REPORT   Patient Name:   [name] Date of Exam: 06/06/2020 Medical Rec #:  [id number]        Height:       62.0 in Accession #:    [id number]       Weight:       154.0 lb Date of Birth:  [birth year]       BSA:          1.711 m Patient Age:    73 years         BP:           111/60 mmHg Patient Gender: F                HR:           93 bpm. [facility] Procedure: 2D Echo Indications:    CHF-Acute Systolic 428.21 / I50.21  History:        Patient has prior history of Echocardiogram examinations, most                 recent 03/29/2020. CHF, Previous Myocardial Infarction,                 Arrythmias:Atrial Fibrillation; Risk Factors:Diabetes and                 Dyslipidemia.  Sonographer:    Jeryl Columbia RDCS (AE) Referring Phys: [id number] AVA SWAYZE IMPRESSIONS  1. Left ventricular ejection fraction, by estimation, is 25 to 30%. The left ventricle has severely decreased function. The left ventricle demonstrates global hypokinesis. There is mild left ventricular hypertrophy. Left ventricular diastolic parameters   are indeterminate.  2. Right ventricular systolic function is low normal. The right ventricular size is moderately enlarged.  3. Left atrial size was severely dilated.  4. Small to moderate. There is no evidence of cardiac tamponade.  5. The mitral valve is normal in structure. Mild mitral valve regurgitation. No evidence of mitral stenosis.  6. The aortic valve is tricuspid. Aortic valve regurgitation is not visualized. No aortic stenosis is present.  7. The inferior vena cava is normal in size with greater than 50% respiratory variability, suggesting right atrial pressure of 3 mmHg. FINDINGS  Left Ventricle: Left ventricular ejection fraction, by estimation, is 25 to 30%. The left ventricle has severely decreased function. The left ventricle  demonstrates global hypokinesis. The left ventricular internal cavity size was normal in size. There is mild left ventricular hypertrophy. Left ventricular diastolic parameters are indeterminate. Right Ventricle: The right ventricular size is moderately enlarged. No increase in right ventricular wall thickness. Right ventricular systolic function is low normal. Left Atrium: Left atrial size was severely dilated. Right Atrium: Right atrial size was normal in size. Pericardium: Small to moderate circumferential pericardial effusion. There is some RA indentation without collapse. There is no RV collapse. Normal IVC. Small to moderate. There is no evidence of cardiac tamponade. Mitral Valve: The mitral valve is normal in structure. Mild mitral valve regurgitation. No evidence of mitral valve stenosis. Tricuspid Valve: The tricuspid valve is normal in structure. Tricuspid valve regurgitation is trivial. No evidence of tricuspid stenosis. Aortic Valve: The aortic valve is tricuspid. Aortic valve regurgitation is not visualized. No aortic stenosis is present. Aortic valve mean gradient measures 2.9 mmHg. Aortic valve peak gradient measures 5.7 mmHg. Aortic valve area, by VTI measures  1.90 cm. Pulmonic Valve: The pulmonic valve was not well visualized. Pulmonic valve regurgitation is not visualized. No evidence of pulmonic stenosis. Aorta: The aortic root is normal in size and structure. Pulmonary Artery: Indeterminant PASP, inadequate TR jet. Venous: The inferior vena cava is normal in size with greater than 50% respiratory variability, suggesting right atrial pressure of 3 mmHg. IAS/Shunts: No atrial level shunt detected by color flow Doppler.  LEFT VENTRICLE PLAX 2D LVIDd:         5.28 cm  Diastology LVIDs:         4.85 cm  LV e' lateral:   9.68 cm/s LV PW:         1.28 cm  LV E/e' lateral: 6.2 LV IVS:        1.04 cm LVOT diam:     2.00 cm LV SV:         45 LV SV Index:   26 LVOT Area:     3.14 cm  RIGHT VENTRICLE RV S prime:     6.83 cm/s TAPSE (M-mode): 1.6 cm LEFT ATRIUM             Index       RIGHT ATRIUM           Index LA diam:        4.40 cm 2.57 cm/m  RA Area:     10.20 cm LA Vol (A2C):   66.0 ml 38.58 ml/m RA Volume:   19.40 ml  11.34 ml/m LA Vol (A4C):   73.3 ml 42.85 ml/m LA Biplane Vol: 75.3 ml 44.02 ml/m  AORTIC VALVE AV Area (Vmax):    2.10 cm AV Area (Vmean):   1.98 cm AV Area (VTI):     1.90 cm AV Vmax:           119.26 cm/s AV Vmean:          81.204 cm/s AV VTI:            0.234 m AV Peak Grad:      5.7 mmHg AV Mean Grad:      2.9 mmHg LVOT Vmax:         79.69 cm/s LVOT Vmean:        51.109 cm/s LVOT VTI:          0.142 m LVOT/AV VTI ratio: 0.61  AORTA Ao Root diam: 2.70 cm MITRAL VALVE MV Area (PHT): 4.12 cm    SHUNTS MV Decel Time: 184 msec  Systemic VTI:  0.14 m MV E velocity: 59.60 cm/s  Systemic Diam: 2.00 cm MV A velocity: 43.30 cm/s MV E/A ratio:  1.38 Dina Rich MD Electronically signed by Dina Rich MD Signature Date/Time: 06/06/2020/4:47:51 PM    Final        Subjective:  Patient seen and examined at bedside this morning.  Significantly more deconsitioned, deconditioned, lying on the bed, very weak  Discharge Exam: Vitals:    06/08/20 1316 06/09/20 0609  BP: 126/73 120/78  Pulse: (!) 147 (!) 107  Resp: 17 18  Temp: 98.9 F (37.2 C) 98.4 F (36.9 C)  SpO2: 97% 100%   Vitals:   06/08/20 0500 06/08/20 0513 06/08/20 1316 06/09/20 0609  BP:  (!) 128/56 126/73 120/78  Pulse:  (!) 106 (!) 147 (!) 107  Resp:  18 17 18   Temp:  98.2 F (36.8 C) 98.9 F (37.2 C) 98.4 F (36.9 C)  TempSrc:   Oral   SpO2:  100% 97% 100%  Weight: 66.5 kg     Height:        General: Pt is alert, awake, weak, chronically ill looking Cardiovascular: RRR, S1/S2 +, no rubs, no gallops Respiratory: CTA bilaterally, no wheezing, no rhonchi Abdominal: Soft, NT, ND, bowel sounds + Extremities: no edema, no cyanosis    The results of significant diagnostics from this hospitalization (including imaging, microbiology, ancillary and laboratory) are listed below for reference.     Microbiology: Recent Results (from the past 240 hour(s))  Culture, blood (Routine x 2)     Status: None   Collection Time: 06/04/20 10:20 PM   Specimen: Left Antecubital; Blood  Result Value Ref Range Status   Specimen Description LEFT ANTECUBITAL  Final   Special Requests   Final    BOTTLES DRAWN AEROBIC AND ANAEROBIC Blood Culture adequate volume   Culture   Final    NO GROWTH 5 DAYS Performed at [facility], [address], [place], [place]    Report Status 06/09/2020 FINAL  Final  Culture, blood (Routine x 2)     Status: None (Preliminary result)   Collection Time: 06/04/20 11:48 PM   Specimen: BLOOD RIGHT ARM  Result Value Ref Range Status   Specimen Description BLOOD RIGHT ARM  Final   Special Requests   Final    BOTTLES DRAWN AEROBIC AND ANAEROBIC Blood Culture adequate volume   Culture   Final    NO GROWTH 4 DAYS Performed at [facility], [address], [place], [place]    Report Status PENDING  Incomplete  SARS Coronavirus 2 by RT PCR (hospital order, performed in [facility] hospital lab) Nasopharyngeal  Nasopharyngeal Swab     Status: None   Collection Time: 06/05/20  2:39 AM   Specimen: Nasopharyngeal Swab  Result Value Ref Range Status   SARS Coronavirus 2 NEGATIVE NEGATIVE Final    Comment: (NOTE) SARS-CoV-2 target nucleic acids are NOT DETECTED.  The SARS-CoV-2 RNA is generally detectable in upper and lower respiratory specimens during the acute phase of infection. The lowest concentration of SARS-CoV-2 viral copies this assay can detect is 250 copies / mL. A negative result does not preclude SARS-CoV-2 infection and should not be used as the sole basis for treatment or other patient management decisions.  A negative result may occur with improper specimen collection / handling, submission of specimen other than nasopharyngeal swab, presence of viral mutation(s) within the areas targeted by this assay, and inadequate number of viral copies (<  250 copies / mL). A negative result must be combined with clinical observations, patient history, and epidemiological information.  Fact Sheet for Patients:   BoilerBrush.com.cyhttps://www.fda.gov/media/136312/download  Fact Sheet for Healthcare Providers: https://pope.com/https://www.fda.gov/media/136313/download  This test is not yet approved or  cleared by the [place] FDA and has been authorized for detection and/or diagnosis of SARS-CoV-2 by FDA under an Emergency Use Authorization (EUA).  This EUA will remain in effect (meaning this test can be used) for the duration of the COVID-19 declaration under Section 564(b)(1) of the Act, 21 U.S.C. section 360bbb-3(b)(1), unless the authorization is terminated or revoked sooner.  Performed at [facility], [address] [place], [place] [address]      Labs: BNP (last 3 results) Recent Labs    06/04/20 2220 06/06/20 1728  BNP 3,003.0* 2,709.0*   Basic Metabolic Panel: Recent Labs  Lab 06/04/20 2220 06/05/20 0503  NA 138 138  K 4.4 3.8  CL 105 107  CO2 27 23  GLUCOSE 105* 122*  BUN 28* 28*  CREATININE  1.07* 1.03*  CALCIUM 8.2* 7.8*  MG  --  1.8   Liver Function Tests: Recent Labs  Lab 06/04/20 2220 06/05/20 0503  AST 10* 9*  ALT 7 9  ALKPHOS 74 61  BILITOT 0.4 0.6  PROT 6.4* 5.5*  ALBUMIN 2.0* 1.7*   No results for input(s): LIPASE, AMYLASE in the last 168 hours. No results for input(s): AMMONIA in the last 168 hours. CBC: Recent Labs  Lab 06/04/20 2220 06/05/20 0503  WBC 12.6* 12.0*  NEUTROABS 10.7*  --   HGB 8.5* 7.2*  HCT 29.4* 24.4*  MCV 81.0 80.8  PLT 302 281   Cardiac Enzymes: No results for input(s): CKTOTAL, CKMB, CKMBINDEX, TROPONINI in the last 168 hours. BNP: Invalid input(s): POCBNP CBG: Recent Labs  Lab 06/05/20 0023 06/05/20 0230 06/05/20 0922 06/05/20 1914 06/05/20 2249  GLUCAP 157* 158* 88 90 135*   D-Dimer No results for input(s): DDIMER in the last 72 hours. Hgb A1c No results for input(s): HGBA1C in the last 72 hours. Lipid Profile No results for input(s): CHOL, HDL, LDLCALC, TRIG, CHOLHDL, LDLDIRECT in the last 72 hours. Thyroid function studies No results for input(s): TSH, T4TOTAL, T3FREE, THYROIDAB in the last 72 hours.  Invalid input(s): FREET3 Anemia work up No results for input(s): VITAMINB12, FOLATE, FERRITIN, TIBC, IRON, RETICCTPCT in the last 72 hours. Urinalysis    Component Value Date/Time   COLORURINE COLORLESS (A) 06/05/2020 0424   APPEARANCEUR CLEAR 06/05/2020 0424   LABSPEC 1.006 06/05/2020 0424   PHURINE 5.0 06/05/2020 0424   GLUCOSEU NEGATIVE 06/05/2020 0424   HGBUR MODERATE (A) 06/05/2020 0424   BILIRUBINUR NEGATIVE 06/05/2020 0424   KETONESUR NEGATIVE 06/05/2020 0424   PROTEINUR NEGATIVE 06/05/2020 0424   NITRITE NEGATIVE 06/05/2020 0424   LEUKOCYTESUR TRACE (A) 06/05/2020 0424   Sepsis Labs Invalid input(s): PROCALCITONIN,  WBC,  LACTICIDVEN Microbiology Recent Results (from the past 240 hour(s))  Culture, blood (Routine x 2)     Status: None   Collection Time: 06/04/20 10:20 PM   Specimen: Left  Antecubital; Blood  Result Value Ref Range Status   Specimen Description LEFT ANTECUBITAL  Final   Special Requests   Final    BOTTLES DRAWN AEROBIC AND ANAEROBIC Blood Culture adequate volume   Culture   Final    NO GROWTH 5 DAYS Performed at [facility], [address] [place], [place] [address]    Report Status 06/09/2020 FINAL  Final  Culture, blood (Routine  x 2)     Status: None (Preliminary result)   Collection Time: 06/04/20 11:48 PM   Specimen: BLOOD RIGHT ARM  Result Value Ref Range Status   Specimen Description BLOOD RIGHT ARM  Final   Special Requests   Final    BOTTLES DRAWN AEROBIC AND ANAEROBIC Blood Culture adequate volume   Culture   Final    NO GROWTH 4 DAYS Performed at [facility], [address], [place], [place] [address]    Report Status PENDING  Incomplete  SARS Coronavirus 2 by RT PCR (hospital order, performed in [facility] hospital lab) Nasopharyngeal Nasopharyngeal Swab     Status: None   Collection Time: 06/05/20  2:39 AM   Specimen: Nasopharyngeal Swab  Result Value Ref Range Status   SARS Coronavirus 2 NEGATIVE NEGATIVE Final    Comment: (NOTE) SARS-CoV-2 target nucleic acids are NOT DETECTED.  The SARS-CoV-2 RNA is generally detectable in upper and lower respiratory specimens during the acute phase of infection. The lowest concentration of SARS-CoV-2 viral copies this assay can detect is 250 copies / mL. A negative result does not preclude SARS-CoV-2 infection and should not be used as the sole basis for treatment or other patient management decisions.  A negative result may occur with improper specimen collection / handling, submission of specimen other than nasopharyngeal swab, presence of viral mutation(s) within the areas targeted by this assay, and inadequate number of viral copies (<250 copies / mL). A negative result must be combined with clinical observations, patient history, and epidemiological information.  Fact Sheet for Patients:    BoilerBrush.com.cy  Fact Sheet for Healthcare Providers: https://pope.com/  This test is not yet approved or  cleared by the [place] FDA and has been authorized for detection and/or diagnosis of SARS-CoV-2 by FDA under an Emergency Use Authorization (EUA).  This EUA will remain in effect (meaning this test can be used) for the duration of the COVID-19 declaration under Section 564(b)(1) of the Act, 21 U.S.C. section 360bbb-3(b)(1), unless the authorization is terminated or revoked sooner.  Performed at [facility], [address], [place], [place] [address]     Please note: You were cared for by a hospitalist during your hospital stay. Once you are discharged, your primary care physician will handle any further medical issues. Please note that NO REFILLS for any discharge medications will be authorized once you are discharged, as it is imperative that you return to your primary care physician (or establish a relationship with a primary care physician if you do not have one) for your post hospital discharge needs so that they can reassess your need for medications and monitor your lab values.    Time coordinating discharge: 40 minutes  SIGNED:   Burnadette Pop, MD  [facility] 06/09/2020, 10:26 AM Pager [phone]  If 7PM-7AM, please contact night-coverage [email or web address] Password [email or web address]

## 2020-06-09 NOTE — Progress Notes (Signed)
Pt discharged to hospice facility via EMS in stable condition.

## 2020-06-09 NOTE — Progress Notes (Signed)
Report given to Clydie Braun at hospice.

## 2020-06-10 LAB — CULTURE, BLOOD (ROUTINE X 2)
Culture: NO GROWTH
Special Requests: ADEQUATE

## 2020-06-13 ENCOUNTER — Encounter (HOSPITAL_BASED_OUTPATIENT_CLINIC_OR_DEPARTMENT_OTHER): Payer: Medicare Other | Admitting: Internal Medicine

## 2020-06-21 MED FILL — Medication: Qty: 1 | Status: AC

## 2020-07-18 DEATH — deceased

## 2022-06-19 IMAGING — MR MR MRA HEAD W/O CM
2 series · 48 of 48 positions shown · non-contrast
Comparison: Prior head CT from earlier the same day.

CLINICAL DATA: Follow-up examination for acute stroke.

EXAM:
MRI HEAD WITHOUT CONTRAST
MRA HEAD WITHOUT CONTRAST
TECHNIQUE: Multiplanar, multiecho pulse sequences of the brain and surrounding
structures were obtained without intravenous contrast. Angiographic
images of the head were obtained using MRA technique without
contrast.

[Series 3: DWI · axial · 3.0mm · 1.09mm/px · z∈[-19,+127]mm · 32 of 100 slices shown (1 of 2)]
[im 1/100]
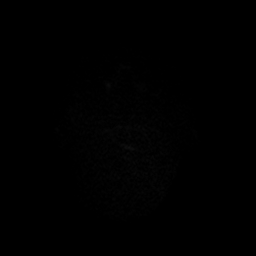
[im 4/100]
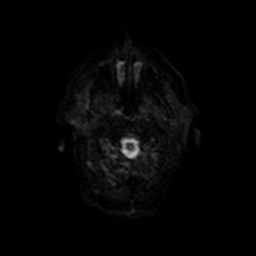
[im 7/100]
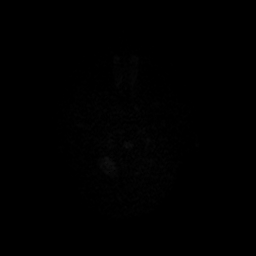
[im 10/100]
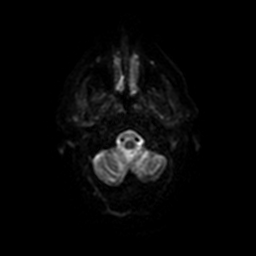
[im 13/100]
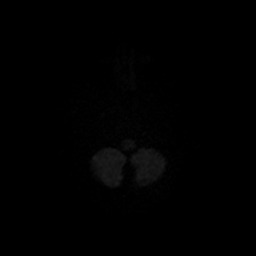
[im 16/100]
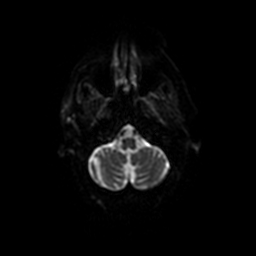
[im 20/100]
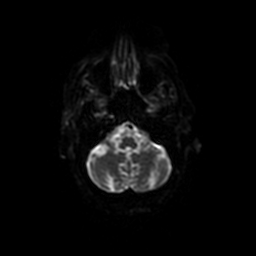
[im 23/100]
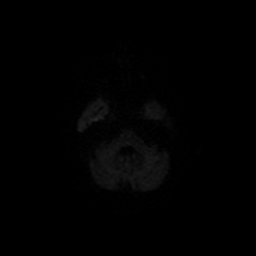
[im 26/100]
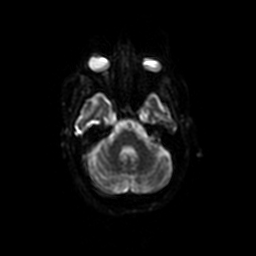
[im 29/100]
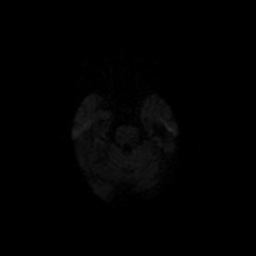
[im 32/100]
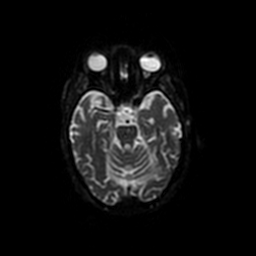
[im 36/100]
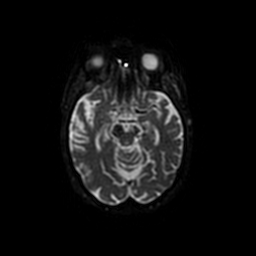
[im 39/100]
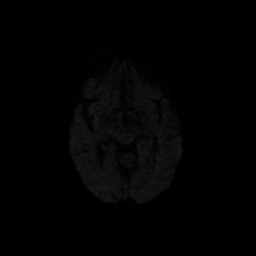
[im 42/100]
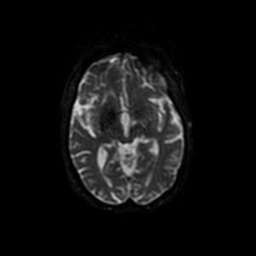
[im 45/100]
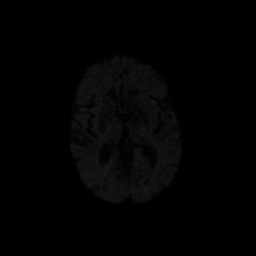
[im 48/100]
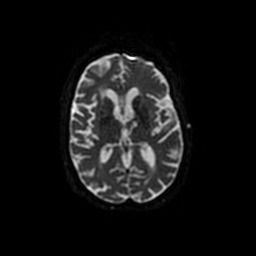
[im 52/100]
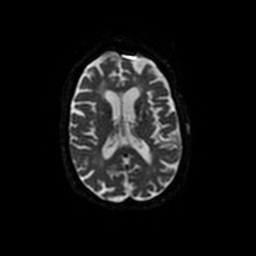
[im 55/100]
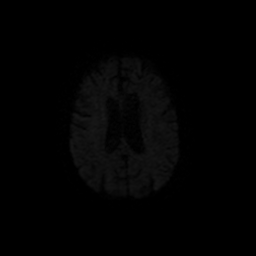
[im 58/100]
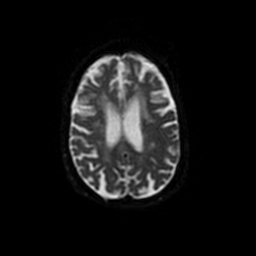
[im 61/100]
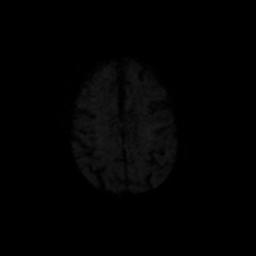
[im 64/100]
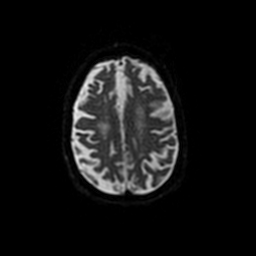
[im 68/100]
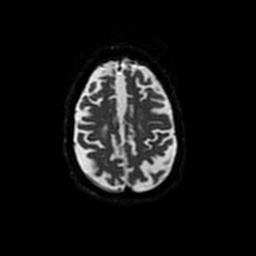
[im 71/100]
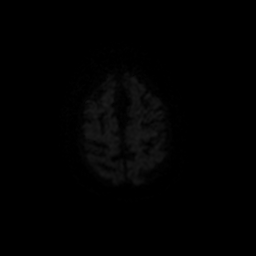
[im 74/100]
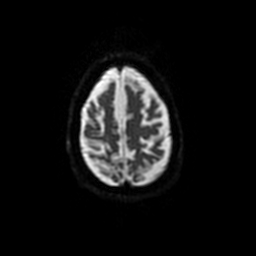
[im 77/100]
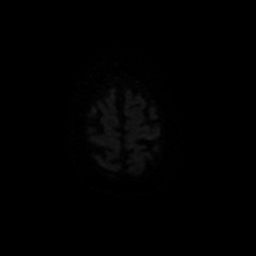
[im 80/100]
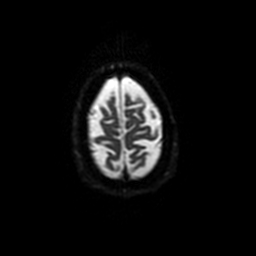
[im 84/100]
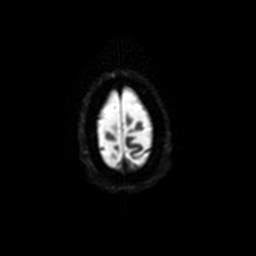
[im 87/100]
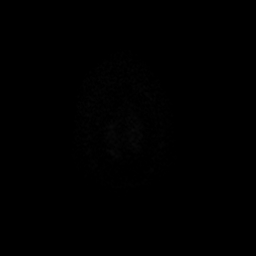
[im 90/100]
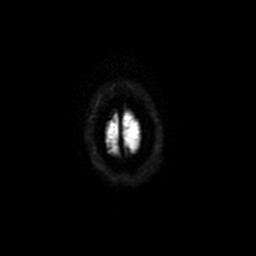
[im 93/100]
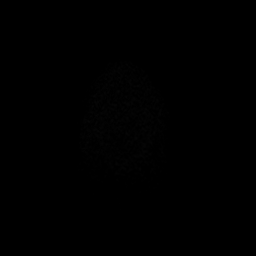
[im 96/100]
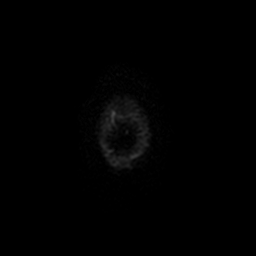
[im 100/100]
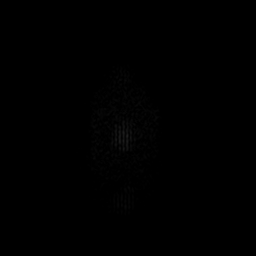

[Series 300: DWI · axial · 3.0mm · 1.09mm/px · z∈[-19,+127]mm · 16 of 50 slices shown (2 of 2)]
[im 1/50]
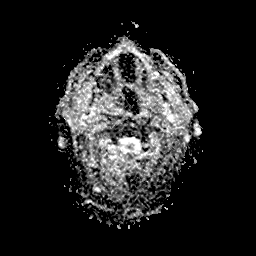
[im 4/50]
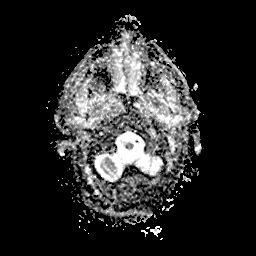
[im 7/50]
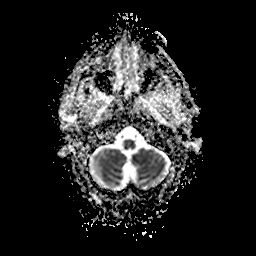
[im 10/50]
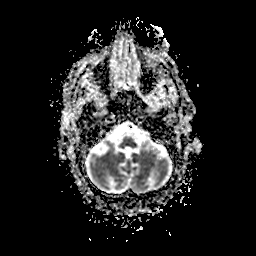
[im 14/50]
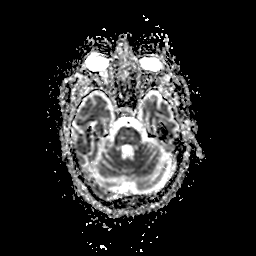
[im 17/50]
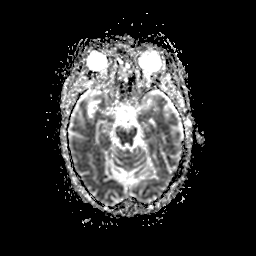
[im 20/50]
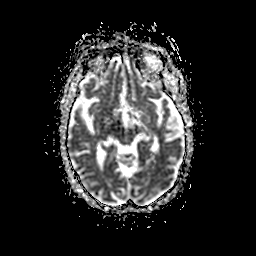
[im 23/50]
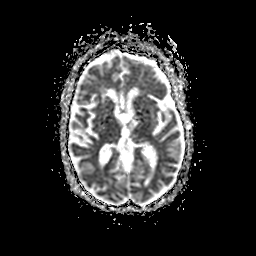
[im 27/50]
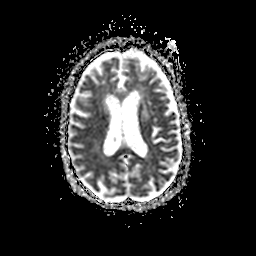
[im 30/50]
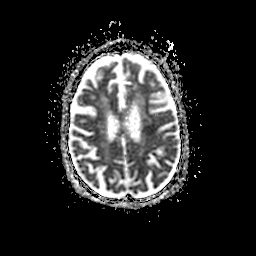
[im 33/50]
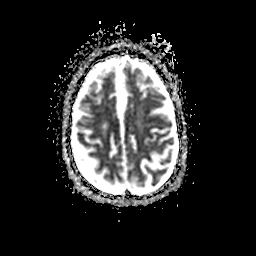
[im 36/50]
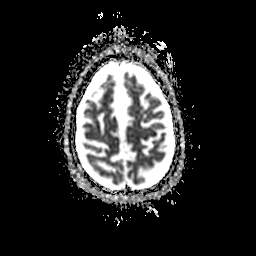
[im 40/50]
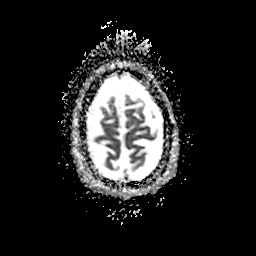
[im 43/50]
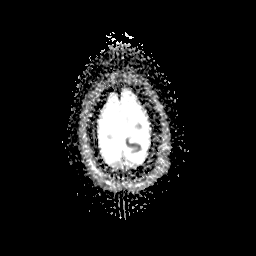
[im 46/50]
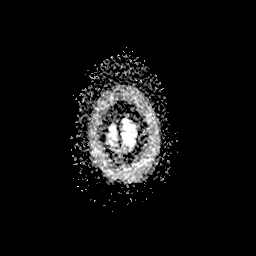
[im 50/50]
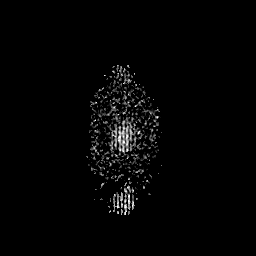

[48 of 48 positions shown; findings below may reference images not displayed]

FINDINGS: MRI HEAD FINDINGS

Brain: Examination severely limited as the patient was unable to
tolerate the exam. An axial DWI sequence only was performed.
Additionally, images provided are degraded by motion artifact.

Diffusion weighted sequence demonstrates no evidence for acute or
subacute infarct. No obvious areas of encephalomalacia to suggest
chronic infarction. No appreciable mass lesion or mass effect. No
midline shift or hydrocephalus. No visible extra-axial fluid
collection.

Vascular: Not assessed on this limited exam.

Skull and upper cervical spine: Not assessed on this limited exam.

Sinuses/Orbits: Not assessed on this limited exam.

Other: None.

MRA HEAD FINDINGS

Dedicated MRA was not performed as the patient was unable to
tolerate the exam.
IMPRESSION: 1. Severely limited study with single axial DWI sequence only
completed. MRA was not performed. Patient was unable to tolerate the
exam.
2. No evidence for acute or subacute ischemia. No other obvious
abnormality on this limited exam.

## 2022-08-25 IMAGING — DX DG CHEST 2V
2 series · 2 of 2 positions shown · non-contrast
Comparison: March 16, 2020

CLINICAL DATA: Hypoglycemia.

EXAM:
CHEST - 2 VIEW

[chest lat]
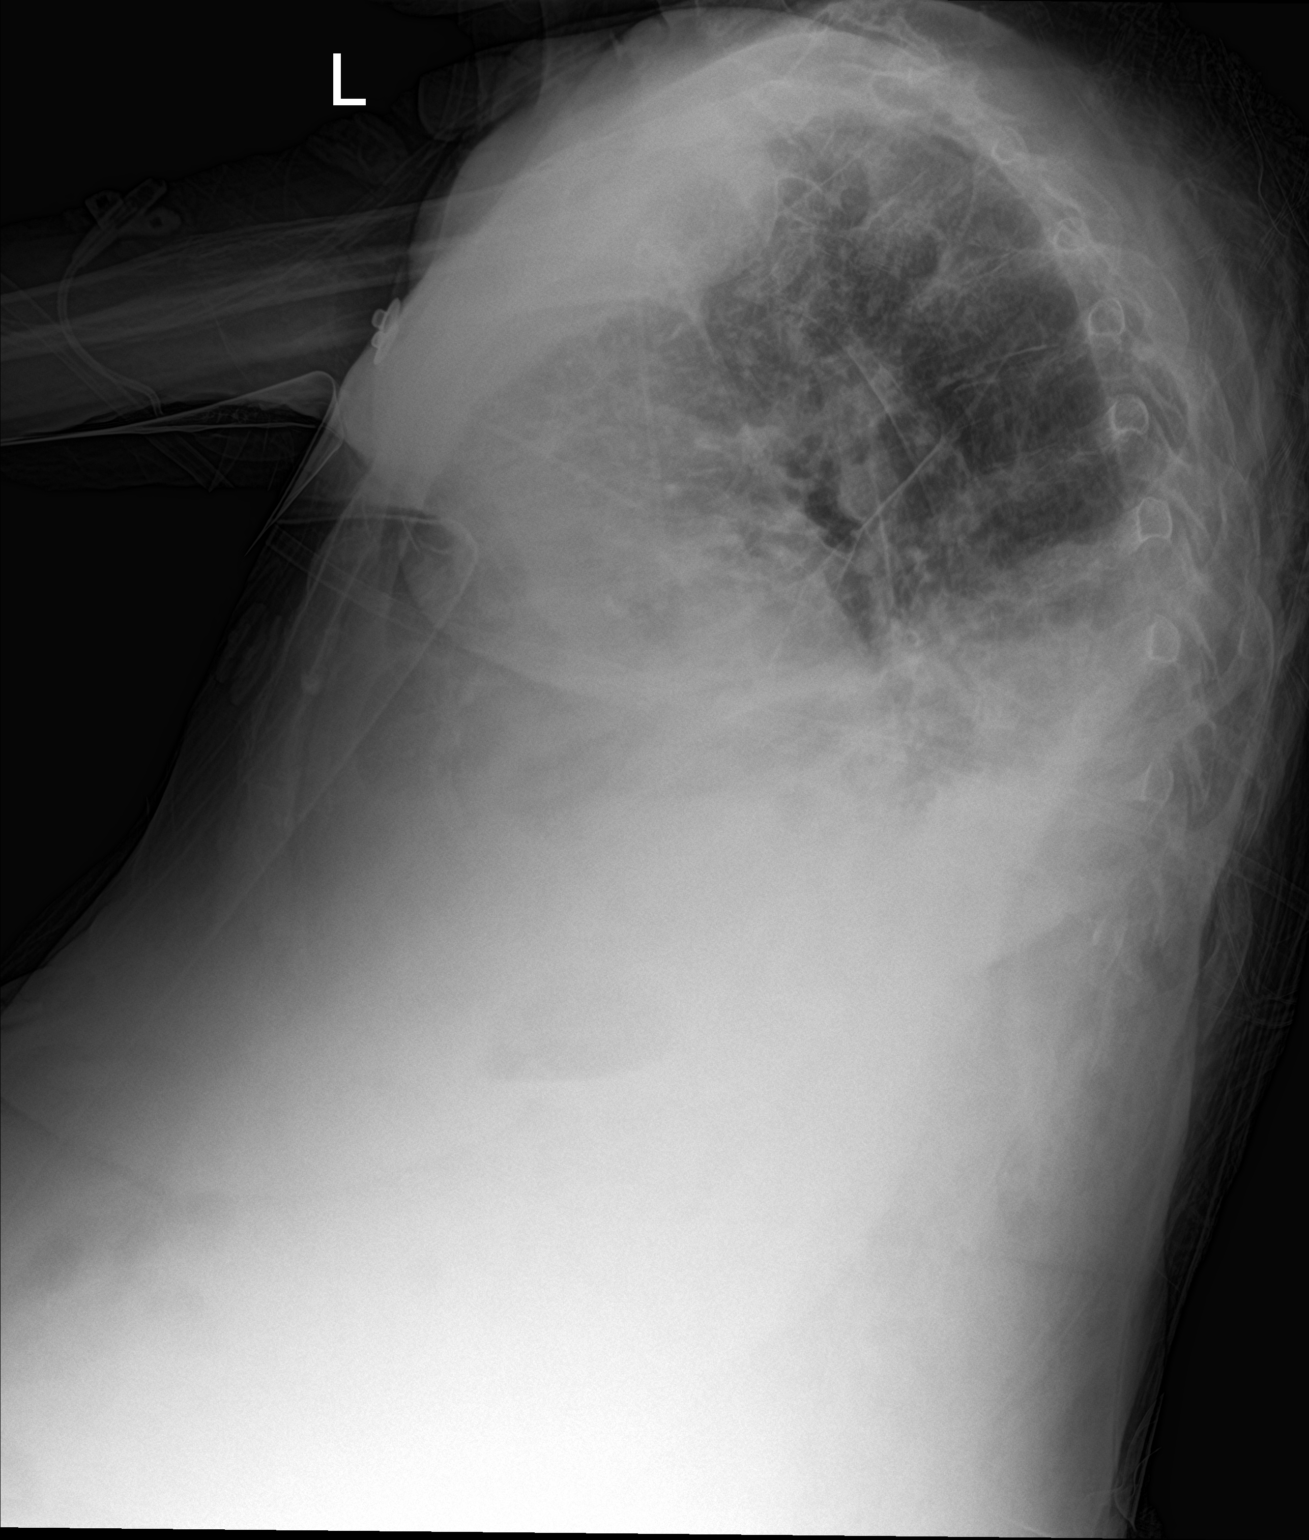

[chest ap]
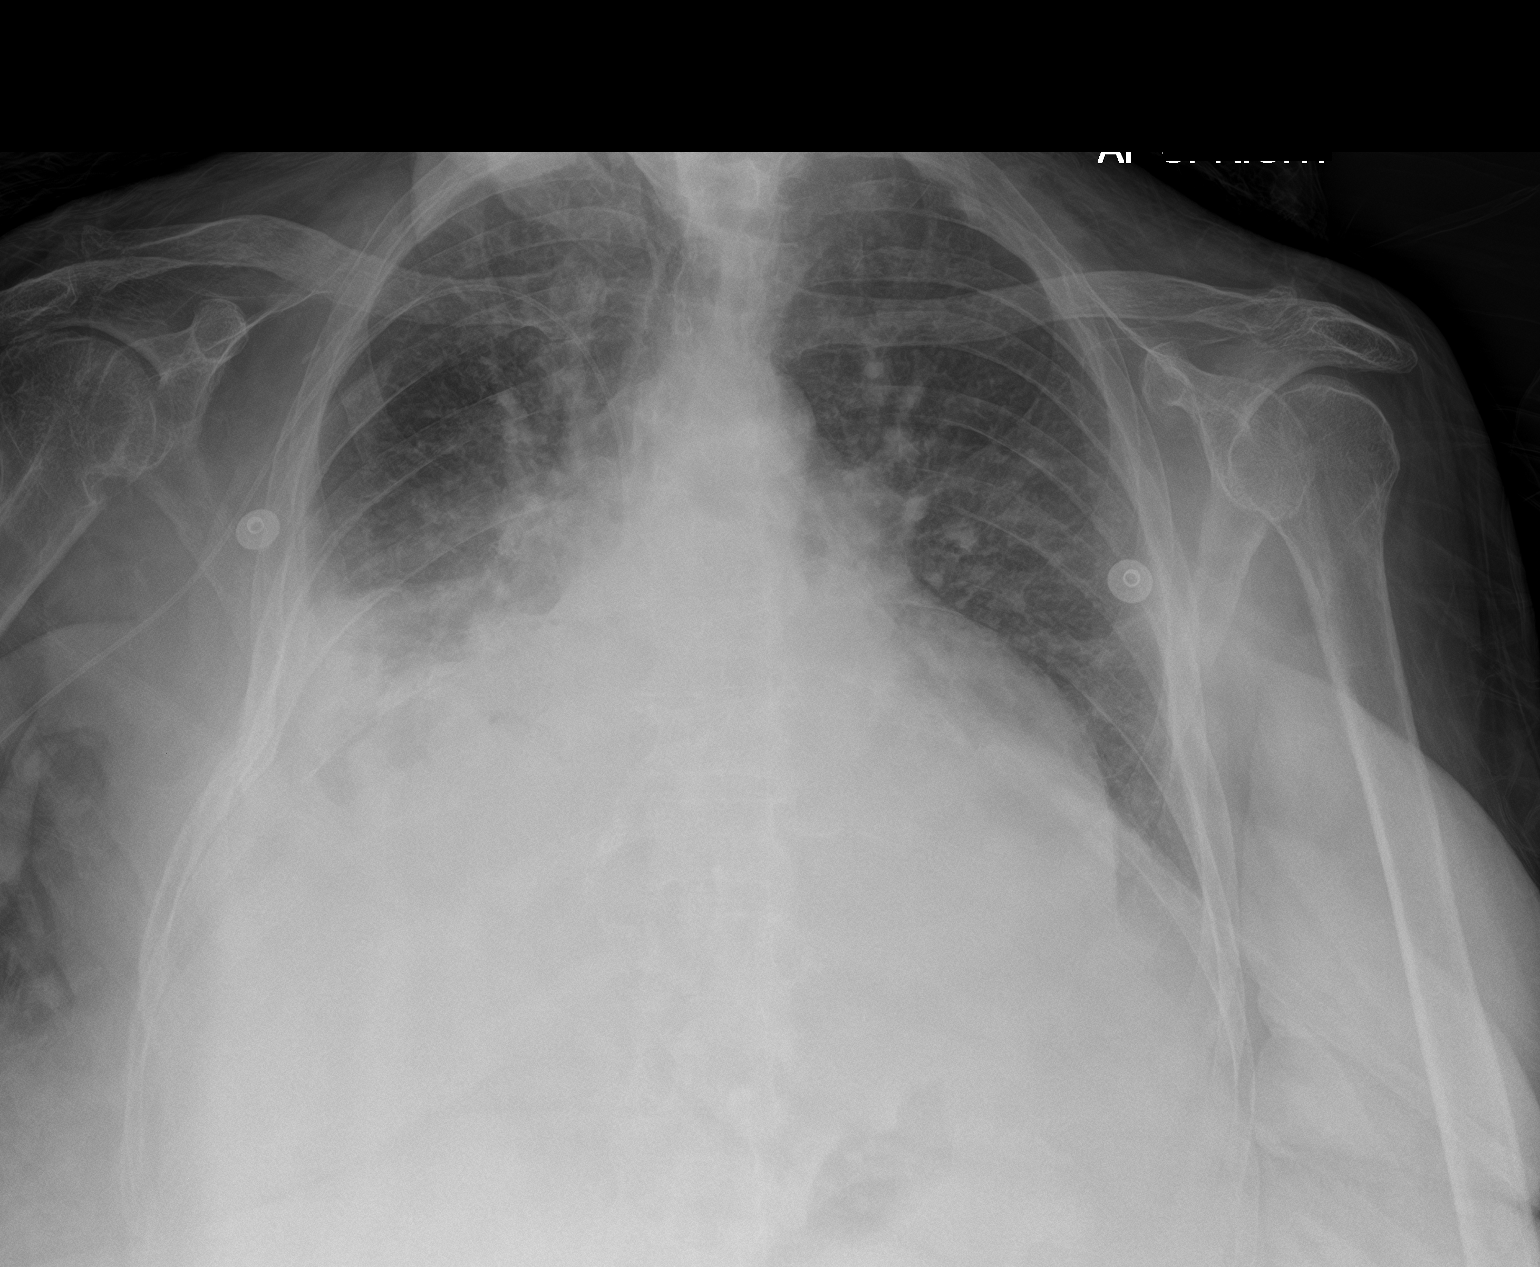

[2 of 2 positions shown; findings below may reference images not displayed]

FINDINGS: A right-sided PICC line is noted with its distal tip seen at the
junction of the superior vena cava and right atrium. Mild, diffuse
chronic appearing increased interstitial lung markings are seen.
Marked severity areas of atelectasis and/or infiltrate are seen
within the bilateral lung bases, right greater than left. Small
bilateral pleural effusions are noted. No pneumothorax is seen. The
cardiac silhouette is markedly enlarged and unchanged in size.
Degenerative changes seen throughout the thoracic spine. A chronic
deformity is seen involving the surgical neck of the proximal right
humerus.
IMPRESSION: 1. Chronic appearing increased interstitial lung markings with
marked severity bibasilar atelectasis and/or infiltrate, right
greater than left.
2. Small bilateral pleural effusions.
3. Stable cardiomegaly.
